# Patient Record
Sex: Male | Born: 1965 | Race: White | Hispanic: No | Smoking: Former smoker
Health system: Southern US, Community
[De-identification: ages and names within clinical notes are randomized; demographics above are authoritative.]

## PROBLEM LIST (undated history)

## (undated) DIAGNOSIS — Z950 Presence of cardiac pacemaker: Secondary | ICD-10-CM

## (undated) DIAGNOSIS — R21 Rash and other nonspecific skin eruption: Secondary | ICD-10-CM

## (undated) DIAGNOSIS — K5190 Ulcerative colitis, unspecified, without complications: Secondary | ICD-10-CM

## (undated) HISTORY — PX: PACEMAKER IMPLANT: EP1218

## (undated) HISTORY — DX: Presence of cardiac pacemaker: Z95.0

---

## 1999-07-15 ENCOUNTER — Encounter (INDEPENDENT_AMBULATORY_CARE_PROVIDER_SITE_OTHER): Payer: Self-pay | Admitting: *Deleted

## 1999-07-15 ENCOUNTER — Ambulatory Visit (HOSPITAL_COMMUNITY): Admission: RE | Admit: 1999-07-15 | Discharge: 1999-07-15 | Payer: Self-pay | Admitting: Gastroenterology

## 2001-03-23 ENCOUNTER — Emergency Department (HOSPITAL_COMMUNITY): Admission: EM | Admit: 2001-03-23 | Discharge: 2001-03-23 | Payer: Self-pay

## 2001-12-02 ENCOUNTER — Encounter: Payer: Self-pay | Admitting: Gastroenterology

## 2001-12-02 ENCOUNTER — Encounter: Admission: RE | Admit: 2001-12-02 | Discharge: 2001-12-02 | Payer: Self-pay | Admitting: Gastroenterology

## 2002-09-19 ENCOUNTER — Encounter (INDEPENDENT_AMBULATORY_CARE_PROVIDER_SITE_OTHER): Payer: Self-pay | Admitting: *Deleted

## 2002-09-19 ENCOUNTER — Ambulatory Visit (HOSPITAL_COMMUNITY): Admission: RE | Admit: 2002-09-19 | Discharge: 2002-09-19 | Payer: Self-pay | Admitting: Gastroenterology

## 2004-09-09 ENCOUNTER — Encounter: Admission: RE | Admit: 2004-09-09 | Discharge: 2004-09-09 | Payer: Self-pay | Admitting: Gastroenterology

## 2004-09-12 ENCOUNTER — Encounter (INDEPENDENT_AMBULATORY_CARE_PROVIDER_SITE_OTHER): Payer: Self-pay | Admitting: Specialist

## 2004-09-12 ENCOUNTER — Ambulatory Visit (HOSPITAL_COMMUNITY): Admission: RE | Admit: 2004-09-12 | Discharge: 2004-09-12 | Payer: Self-pay | Admitting: Gastroenterology

## 2004-10-11 ENCOUNTER — Encounter: Admission: RE | Admit: 2004-10-11 | Discharge: 2004-10-11 | Payer: Self-pay | Admitting: Gastroenterology

## 2006-06-02 ENCOUNTER — Ambulatory Visit: Payer: Self-pay | Admitting: Cardiovascular Disease

## 2007-08-25 ENCOUNTER — Ambulatory Visit: Payer: Self-pay | Admitting: Cardiovascular Disease

## 2007-09-15 ENCOUNTER — Ambulatory Visit: Payer: Self-pay | Admitting: Cardiology

## 2007-09-15 ENCOUNTER — Ambulatory Visit: Payer: Self-pay

## 2008-02-29 ENCOUNTER — Ambulatory Visit: Payer: Self-pay | Admitting: Cardiovascular Disease

## 2010-06-11 NOTE — Letter (Signed)
Jun 02, 2006    Ricky [name], Ricky [place]  [name], Ricky  [name]   RE:  Ricky, [name]  MRN:  [id number]  /  DOB:  05-14-1965   Dear Ricky [name],   It was my pleasure to see Ricky [name] as an outpatient at the  [facility] Cardiology office on Jun 02, 2006.  As you know, he is a 45-year-  old gentleman who presented with a chief complaint of chest pain.   Ricky [name] describes an episode of chest pain that felt like an ache  in the mid chest area yesterday evening.  It occurred at rest.  The  total duration of pain was 20 minutes.  It was nonradiating.  He has had  similar episodes in the past, but it has been a long time since his  previous episode.  He is unable to remember exactly how long it has been  since an episode of chest discomfort.  He did not have any associated  symptoms.  He specifically denies shortness of breath, edema,  lightheadedness, diaphoresis, nausea, or vomiting.  He has not had any  recent episodes of syncope, orthopnea, PND, leg claudication, or any  other cardiovascular complaints.  He has had no prior evaluation for  chest pain.   Current medications include only Asacol and Fibercon.   ALLERGIES:  TETRACYCLINE.   PAST MEDICAL HISTORY:  1. Ulcerative colitis.  He was diagnosed in 1993 after he had a severe      bout of diarrhea and abdominal pain.  He has done very well in the      interim on Asacol.  He is under the treatment of Dr. Teena Irani.  2. Questionable history of mitral valve prolapse.   There is no history of hypertension, diabetes, dyslipidemia, or any  other hospitalizations or surgeries except as mentioned.   SOCIAL HISTORY:  He is separated from his spouse.  He has one child.  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  He  does not smoke cigarettes, drink alcohol, or use any illicit drugs.   FAMILY HISTORY:  His parents are both alive in their 68s.  His  father  has diabetes and has had a TIA.  He has one brother.  There is no  history of coronary artery disease in any first-degree relatives.   REVIEW OF SYSTEMS:  A complete 12-point review of systems was performed.  Pertinent positives include seasonal allergies, constipation.  All other  systems are reviewed and are negative except as detailed above.   PHYSICAL EXAMINATION:  VITAL SIGNS:  Weight is 182 pounds.  Blood  pressure is 108/78 in the right arm, 103/72 in the left arm.  Heart rate  64.  Respiratory rate is 12.  GENERAL:  Patient is alert and oriented.  He is in no acute distress.  HEENT:  Normal.  NECK:  Normal carotid upstrokes without bruits.  Jugular venous pressure  is normal.  No thyromegaly or thyroid nodules.  LUNGS:  Clear to auscultation bilaterally.  HEART:  The apex is discrete and nondisplaced.  The heart is a regular  rate and rhythm without murmurs or gallops.  There is no right  ventricular heave or lift.  ABDOMEN:  Soft and nontender.  No organomegaly.  No abdominal bruits.  EXTREMITIES:  No clubbing, cyanosis or edema.  Peripheral pulses are 2+  and equal throughout.  SKIN:  Warm and  dry without rash.  LYMPHATICS:  There is no adenopathy.  NEUROLOGIC:  Cranial nerves II-XII are intact.  Strength is 5/5 and  equal in the arms and legs bilaterally.   EKG demonstrates a normal sinus rhythm with a borderline right  intraventricular conduction delay and T waves at the upper limits of  normal.   Ricky [name] is a 45 year old male with an episode of chest pain.  The  etiology of  his chest pain is unclear at this point.  I think his  overall cardiovascular risk is low, based on a paucity of risk factors.  I would like to perform an exercise treadmill stress test to see if his  T waves normalize with exertion.  I think his T wave abnormality is  likely a normal variant.  Pending the results of his stress test, we  will make a determination as to whether he  needs any further studies.  I  suspect an exercise treadmill will be sufficient.   At this point, I would not recommend any medication changes.  His blood  pressure is ideal, and I think the risk:benefit ratio of aspirin is not  in favor of taking that medication in the setting of ulcerative colitis.   Ricky [name], thanks again for the opportunity to see Ricky [name].  I will be  in touch after his exercise treadmill study.  Please feel free to call  at any time with questions regarding his care.    Sincerely,      Juanda Bond. Burt Knack, MD    MDC/MedQ  DD: 06/02/2006  DT: 06/02/2006  Job #: [id number]

## 2010-06-11 NOTE — Assessment & Plan Note (Signed)
[place] OFFICE NOTE   NAME:[name], Ricky KELTNER                    MRN:          [id number]  DATE:08/25/2007                            DOB:          12/03/1965    Ricky [name] was seen in followup at the [facility] Cardiology Office on  August 25, 2007.  He is a 45 year old gentleman who presents for  evaluation of near syncope.  I saw Ricky [name] last year for evaluation  of chest pain.  His symptoms were atypical and he has a paucity of  cardiac risk factors.  I recommended an exercise treadmill study, but  the patient did not return for followup.  He presents now after two near  syncopal episodes.  The first episode occurred in January when he was  out working in the cold weather.  He works in the [employer].  He went inside of a truck with the engine running in order  to warm up.  He realized that he was becoming very hot since he was in  all of his gear and he became lightheaded and nauseous.  After lying  down, his symptoms abated.  He did not lose consciousness at that time.  He recovered without incident until approximately 2 weeks ago when he  was once again working.  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  He describes diaphoresis and nausea.  He had no  chest pain, dyspnea, or other associated symptoms.  He again was able to  lie down and his symptoms resolved on their own.  In the interim, he has  had no problems.  He specifically denies exertional chest pain, dyspnea,  orthopnea, PND, or edema.   MEDICATIONS:  None.   ALLERGIES:  TETRACYCLINE.   PHYSICAL EXAMINATION:  GENERAL:  The patient is alert and oriented in no  acute distress.  VITALS:  Blood pressure is 104/80, pulse is 58, weight is 187 pounds.  HEENT:  Normal.  NECK:  Normal carotid upstrokes without bruits.  JVP normal.  LUNGS:  Clear bilaterally.  HEART:  Regular rate and rhythm without  murmurs or gallops.  ABDOMEN:  Soft, nontender, no organomegaly.  EXTREMITIES:  No clubbing, cyanosis, or edema.  Peripheral pulses 2+ and  equal.   EKG from July 10 shows sinus bradycardia and otherwise is within normal  limits.   ASSESSMENT:  This is a 45 year old gentleman with recurrent near  syncopal episodes.  He has no palpitations, chest pain, or other  associated symptoms.  By history, his symptoms sound like neurodepressor  episodes.  I still would like Ricky [name] to undergo an exercise  treadmill study to evaluate exercise tolerance and EKG changes with  activity.  I think this would be a reasonable evaluation in this  gentleman with minimal cardiac risk factors and a normal resting EKG.  If this is normal, I would not recommend further evaluation.  His  clinical exam is normal and there is no evidence of structural heart  disease.  I will see him back in follow up in 6 months  to make sure that he is not having recurrent problems.  In the interim,  I advised plenty of fluid intake and frequent meals.     Ricky Bond. Burt Knack, MD  Electronically Signed    MDC/MedQ  DD: 08/25/2007  DT: 08/26/2007  Job #: [id number]

## 2010-06-11 NOTE — Procedures (Signed)
[facility]                              EXERCISE TREADMILL   NAME:[name], [name]                    MRN:          [id number]  DATE:09/15/2007                            DOB:          Mar 14, 1965    PRIMARY CARDIOLOGIST:  Juanda Bond. Burt Knack, MD   [name] is a 46 year old white male patient who is having a stress  test today because of symptoms of presyncope.  The patient exercised 11  minutes 16 seconds with the Bruce protocol obtaining a peak heart rate  of 166, which was 93% of his maximum predicted heart rate.  He had  normal blood pressure response.  Test was stopped due to ankle and knee  pain.  He denies any dizziness or presyncopal signs or symptoms with  exercise.  EKG, baseline EKG, normal sinus rhythm, normal EKG.  He had  no ST-T wave.  He had no ST.  No significant EKG changes.  He did have  some peak T-waves during recovery.  Dr. Burt Knack to read final report.  The patient had good exercise tolerance.      Ermalinda Barrios, PA-C  Electronically Signed      Vanna Scotland. Olevia Perches, MD, [facility]  Electronically Signed   ML/MedQ  DD: 09/15/2007  DT: 09/16/2007  Job #: [id number]

## 2010-06-14 NOTE — Op Note (Signed)
   NAME:  Ricky [name], Ricky [name]                       ACCOUNT NO.:  [id number]   MEDICAL RECORD NO.:  [id number]                   PATIENT TYPE:  AMB   LOCATION:  ENDO                                 FACILITY:  [facility]   PHYSICIAN:  John C. Amedeo Plenty, M.D.                 DATE OF BIRTH:  Feb 22, 1965   DATE OF PROCEDURE:  09/19/2002  DATE OF DISCHARGE:                                 OPERATIVE REPORT   PROCEDURE:  Colonoscopy.   INDICATIONS FOR PROCEDURE:  A patient with chronic universal ulcerative  colitis of greater than 10 years duration. The procedure is for assessment  of possible dysplasia.   DESCRIPTION OF PROCEDURE:  The patient was placed in the left lateral  decubitus position then placed on the pulse monitor with continuous low flow  oxygen delivered by nasal cannula. He was sedated with 100 mcg IV fentanyl  and 9 mg IV Versed. The Olympus video colonoscope was inserted into the  rectum and advanced to the cecum, confirmed by transillumination at  McBurney's point and visualization of the ileocecal valve and appendiceal  orifice. The prep was excellent. There were diffuse mild changes of  erythema, granularity, friability and loss of the normal vascular pattern in  various areas of the colon predominantly in the ascending colon and cecum  with a fair amount of skipped areas. The involvement was similar but milder  and more patchy in distribution in the transverse and descending colon. The  sigmoid and rectum looked relatively normal,  no masses, polyps, diverticula  or other mucosal abnormalities were seen. Eight random biopsies were taken  of various segments of the colon to rule out dysplasia. The scope was then  withdrawn and the patient returned to the recovery room in stable condition.  The patient tolerated the procedure well and there were no immediate  complications.   IMPRESSION:  Mild active colitis more prominent in the right colon.   PLAN:  Await biopsy  results.                                               John C. Amedeo Plenty, M.D.    JCH/MEDQ  D:  09/19/2002  T:  09/19/2002  Job:  [id number]

## 2010-06-14 NOTE — Procedures (Signed)
[place]  Patient:    Ricky [name], Ricky [name]                    MRN: [id number] Proc. Date: 07/15/99 Adm. Date:  [id number] Disc. Date: [id number] Attending:  Rafael Bihari CC:         Dr. Guinevere Ferrari                           Procedure Report  PROCEDURE:  Colonoscopy.  ENDOSCOPIST:  Elyse Jarvis. Amedeo Plenty, M.D.  ANESTHESIA:  INDICATIONS FOR PROCEDURE:  Patient with ulcerative colitis involving the entire colon of at least eight years duration.  Due for endoscopic surveillance to assess for dysplasia, polyps, or colon cancer.  DESCRIPTION OF PROCEDURE:  The patient was placed in the left lateral decubitus position and placed on the pulse monitor with continuous low flow oxygen delivered by nasal cannula.  He was sedated with 100 mg of IV Demerol and 10 mg of IV Versed.  The Olympus video colonoscope was inserted into the rectum and advanced to the cecum, confirmed by transillumination at McBurneys point and visualization of the ileocecal valve and appendiceal orifice.  The terminal ileum was intubated and scored for several centimeters, and appeared to be normal.  Biopsies were taken of that area.  The cecum appeared normal with possible slight erythema and granularity, and biopsies were taken of the cecum as well.  The ascending colon appeared relatively normal.  In the transverse colon, there was a long, linear, fibrotic raised area which almost looked like a surgical anastomosis, but he had never had any surgery, and this is presumed a scar from healed colitis.  Biopsies were taken of the transverse colon in the vicinity of this ridge, but the remainder of the transverse, descending, and sigmoid colon appeared normal.  In the rectum, there were multiple discrete abscessed ulcers consistent with colitis with surrounding erythema and granularity, and biopsies were taken of the rectum as well.  The colonoscope was then withdrawn and the patient  returned to the recovery room in stable condition.  He tolerated the procedure well and there were no immediate complications.  IMPRESSION:  Evidence of colitis of the rectum and transverse colon with possible subtle changes of colitis in the cecum and other areas.  The terminal ileum appeared normal.  PLAN:  Await biopsy results to guide further therapy. DD:  07/15/99 TD:  07/17/99 Job: [id number]
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
## 2010-06-14 NOTE — Op Note (Signed)
NAME:  Ricky [name], Ricky [name]             ACCOUNT NO.:  [id number]   MEDICAL RECORD NO.:  [id number]          PATIENT TYPE:  AMB   LOCATION:  ENDO                         FACILITY:  [facility]   PHYSICIAN:  John C. Amedeo Plenty, M.D.    DATE OF BIRTH:  29-Jan-1965   DATE OF PROCEDURE:  09/12/2004  DATE OF DISCHARGE:                                 OPERATIVE REPORT   INDICATIONS FOR PROCEDURE:  Chronic ulcerative pancolitis of greater than 10  years duration.  The procedure is for high risk colon cancer screening.   PROCEDURE:  The patient was placed in the left lateral decubitus position  and placed on the pulse monitor with continuous low-flow oxygen delivered by  nasal cannula. He was sedated with 75 mcg IV fentanyl and 7 mg IV Versed.  The Olympus video colonoscope was inserted into the rectum and advanced to  the cecum, confirmed by transillumination of McBurney's point and  visualization of the ileocecal valve and appendiceal orifice.  The prep was  excellent. Within the cecum, ascending and transverse colon there was  diffuse granularity and friability with some streaks of fibrosis  particularly in the transverse colon. This very gradually faded to near  normal mucosa by about the descending to sigmoid colon and the rectal mucosa  appeared visibly normal. Multiple biopsies were taken along the cecum,  ascending, transverse and descending colon. No masses, polyps, diverticula,  ulcers, polyps or pseudopolyps were seen anywhere. The rectum appeared  normal. On retroflexed view, the anus revealed no obvious internal  hemorrhoids. The scope was then withdrawn and the patient returned to the  recovery room in stable condition.  He tolerated the procedure well and  there were no immediate complications.   IMPRESSION:  Ulcerative colitis.   PLAN:  Await histology to rule out dysplasia and if no high-grade dysplasia,  will probably repeat study in two years.           ______________________________  Elyse Jarvis Amedeo Plenty, M.D.     JCH/MEDQ  D:  09/12/2004  T:  09/12/2004  Job:  [id number]

## 2015-04-18 ENCOUNTER — Emergency Department (HOSPITAL_COMMUNITY): Payer: BC Managed Care – PPO

## 2015-04-18 ENCOUNTER — Emergency Department (HOSPITAL_COMMUNITY)
Admission: EM | Admit: 2015-04-18 | Discharge: 2015-04-18 | Disposition: A | Discharge status: Home / Self Care | Payer: BC Managed Care – PPO | Attending: Emergency Medicine | Admitting: Emergency Medicine

## 2015-04-18 ENCOUNTER — Encounter (HOSPITAL_COMMUNITY): Payer: Self-pay | Admitting: *Deleted

## 2015-04-18 DIAGNOSIS — B349 Viral infection, unspecified: Secondary | ICD-10-CM | POA: Insufficient documentation

## 2015-04-18 DIAGNOSIS — R55 Syncope and collapse: Secondary | ICD-10-CM | POA: Diagnosis not present

## 2015-04-18 DIAGNOSIS — Z87891 Personal history of nicotine dependence: Secondary | ICD-10-CM | POA: Insufficient documentation

## 2015-04-18 DIAGNOSIS — W228XXA Striking against or struck by other objects, initial encounter: Secondary | ICD-10-CM | POA: Insufficient documentation

## 2015-04-18 DIAGNOSIS — Z8719 Personal history of other diseases of the digestive system: Secondary | ICD-10-CM | POA: Diagnosis not present

## 2015-04-18 DIAGNOSIS — Y9289 Other specified places as the place of occurrence of the external cause: Secondary | ICD-10-CM | POA: Diagnosis not present

## 2015-04-18 DIAGNOSIS — Z79899 Other long term (current) drug therapy: Secondary | ICD-10-CM | POA: Insufficient documentation

## 2015-04-18 DIAGNOSIS — R21 Rash and other nonspecific skin eruption: Secondary | ICD-10-CM | POA: Insufficient documentation

## 2015-04-18 DIAGNOSIS — Y998 Other external cause status: Secondary | ICD-10-CM | POA: Diagnosis not present

## 2015-04-18 DIAGNOSIS — S0181XA Laceration without foreign body of other part of head, initial encounter: Secondary | ICD-10-CM | POA: Insufficient documentation

## 2015-04-18 DIAGNOSIS — J029 Acute pharyngitis, unspecified: Secondary | ICD-10-CM | POA: Diagnosis present

## 2015-04-18 DIAGNOSIS — Y9389 Activity, other specified: Secondary | ICD-10-CM | POA: Diagnosis not present

## 2015-04-18 DIAGNOSIS — K5190 Ulcerative colitis, unspecified, without complications: Secondary | ICD-10-CM | POA: Insufficient documentation

## 2015-04-18 HISTORY — DX: Ulcerative colitis, unspecified, without complications: K51.90

## 2015-04-18 HISTORY — DX: Rash and other nonspecific skin eruption: R21

## 2015-04-18 LAB — RAPID STREP SCREEN (MED CTR MEBANE ONLY): STREPTOCOCCUS, GROUP A SCREEN (DIRECT): NEGATIVE

## 2015-04-18 NOTE — ED Notes (Signed)
PT reports Flu Sx'S started on Monday after noon. Pt reports feeling generally weak ,and having cough with sore throat. Pt went to [facility] on [address] today and passed out in lobby. Pt hie his head and has  Bil. Skin tears to arms. Pt AO  And speaking in full sentences.

## 2015-04-18 NOTE — ED Notes (Signed)
Pt able to ambulate to the restroom independently

## 2015-04-18 NOTE — Discharge Instructions (Signed)
Please read and follow all provided instructions.  Your diagnoses today include:  1. Viral syndrome   2. Syncope    Tests performed today include:  Vital signs. See below for your results today.   Medications prescribed:   None   Home care instructions:  Follow any educational materials contained in this packet.  Follow-up instructions: Please follow-up with your primary care provider in the next week for further evaluation of symptoms and treatment   Return instructions:   Please return to the Emergency Department if you do not get better, if you get worse, or new symptoms OR  - Fever (temperature greater than 101.63F)  - Bleeding that does not stop with holding pressure to the area    -Severe pain (please note that you may be more sore the day after your accident)  - Chest Pain  - Difficulty breathing  - Severe nausea or vomiting  - Inability to tolerate food and liquids  - Passing out  - Skin becoming red around your wounds  - Change in mental status (confusion or lethargy)  - New numbness or weakness     Please return if you have any other emergent concerns.  Additional Information:  Your vital signs today were: BP 115/81 mmHg   Pulse 75   Resp 16   SpO2 98% If your blood pressure (BP) was elevated above 135/85 this visit, please have this repeated by your doctor within one month. ---------------

## 2015-04-18 NOTE — ED Provider Notes (Signed)
CSN: [id number]     Arrival date & time 04/18/15  1317 History   First MD Initiated Contact with Patient 04/18/15 1350     Chief Complaint  Patient presents with  . Cough  . Sore Throat  . Weakness   (Consider location/radiation/quality/duration/timing/severity/associated sxs/prior Treatment) HPI 50 y.o. male presents to the Emergency Department today complaining of URI symptoms since Monday. Associated weakness, cough, sore throat. Note sick contacts at work. No CP/SOB/ABD pain. Notes that he went to an UC this morning. As he was receiving fluids, he sat up and had a witnessed syncopal episode. Lasted 5 sec. Suffered hematoma to forehead with small superficial laceration. Noted bilateral skin tears on BUE. No pain currently. No headache. No changes in vision. No N/V/D. No other symptoms noted. Pt not on any anticoagulation.     Past Medical History  Diagnosis Date  . Ulcerative colitis ([place])   . Rash and nonspecific skin eruption    History reviewed. No pertinent past surgical history. History reviewed. No pertinent family history. Social History  Substance Use Topics  . Smoking status: Former Research scientist (life sciences)  . Smokeless tobacco: Never Used  . Alcohol Use: No    Review of Systems ROS reviewed and all are negative for acute change except as noted in the HPI.  Allergies  Tetracyclines & related  Home Medications   Prior to Admission medications   Medication Sig Start Date End Date Taking? Authorizing Provider  docusate sodium (COLACE) 100 MG capsule Take 100 mg by mouth daily.   Yes Historical Provider, MD  hydrOXYzine (ATARAX/VISTARIL) 25 MG tablet Take 25 mg by mouth every 6 (six) hours as needed.   Yes Historical Provider, MD  ibuprofen (ADVIL,MOTRIN) 200 MG tablet Take 400 mg by mouth every 6 (six) hours as needed for fever.   Yes Historical Provider, MD  mesalamine (ASACOL) 400 MG EC tablet Take 800 mg by mouth 3 (three) times daily.   Yes Historical Provider, MD  metoprolol  tartrate (LOPRESSOR) 25 MG tablet Take 25 mg by mouth 2 (two) times daily.   Yes Historical Provider, MD   BP 115/81 mmHg  Pulse 75  Resp 16  SpO2 98%   Physical Exam  Constitutional: He is oriented to person, place, and time. He appears well-developed and well-nourished. No distress.  HENT:  Head: Normocephalic and atraumatic.  Right Ear: Tympanic membrane, external ear and ear canal normal.  Left Ear: Tympanic membrane, external ear and ear canal normal.  Nose: Nose normal.  Mouth/Throat: Uvula is midline, oropharynx is clear and moist and mucous membranes are normal. No trismus in the jaw. No oropharyngeal exudate, posterior oropharyngeal erythema or tonsillar abscesses.  Eyes: EOM are normal. Pupils are equal, round, and reactive to light.  Neck: Normal range of motion. Neck supple. No tracheal deviation present.  Cardiovascular: Normal rate, regular rhythm, S1 normal, S2 normal, normal heart sounds, intact distal pulses and normal pulses.   No murmur heard. Pulmonary/Chest: Effort normal and breath sounds normal. No respiratory distress. He has no decreased breath sounds. He has no wheezes. He has no rhonchi. He has no rales. He exhibits no tenderness.  Abdominal: Soft. Normal appearance and bowel sounds are normal. There is no tenderness.  Musculoskeletal: Normal range of motion.  Neurological: He is alert and oriented to person, place, and time.  Cranial Nerves:  II: Pupils equal, round, reactive to light III,IV, VI: ptosis not present, extra-ocular motions intact bilaterally  V,VII: smile symmetric, facial light touch sensation equal VIII:  hearing grossly normal bilaterally  IX,X: midline uvula rise  XI: bilateral shoulder shrug equal and strong XII: midline tongue extension  Skin: Skin is warm and dry.  Psychiatric: He has a normal mood and affect. His speech is normal and behavior is normal. Thought content normal.  Nursing note and vitals reviewed.  ED Course   Procedures (including critical care time) Labs Review Labs Reviewed  RAPID STREP SCREEN (NOT AT [facility])  CULTURE, GROUP A STREP [facility])   Imaging Review Dg Chest 2 View  04/18/2015  CLINICAL DATA:  Flu-like symptoms, chills, cough, fatigue EXAM: CHEST  2 VIEW COMPARISON:  05/15/08 FINDINGS: Cardiomediastinal silhouette is unremarkable. Dual lead cardiac pacemaker with leads in right atrium and right ventricle. No acute infiltrate or pleural effusion. No pulmonary edema. Mild elevation of the right hemidiaphragm. IMPRESSION: No active cardiopulmonary disease. Dual lead cardiac pacemaker in place. Electronically Signed   By: Lahoma Crocker M.D.   On: 04/18/2015 14:59   Ct Head Wo Contrast  04/18/2015  CLINICAL DATA:  Syncope with a forehead laceration today. Initial encounter. EXAM: CT HEAD WITHOUT CONTRAST CT CERVICAL SPINE WITHOUT CONTRAST TECHNIQUE: Multidetector CT imaging of the head and cervical spine was performed following the standard protocol without intravenous contrast. Multiplanar CT image reconstructions of the cervical spine were also generated. COMPARISON:  None. FINDINGS: CT HEAD FINDINGS The brain appears normal without hemorrhage, infarct, mass lesion, mass effect, midline shift or abnormal extra-axial fluid collection. No hydrocephalus or pneumocephalus. The calvarium is intact. Imaged paranasal sinuses and mastoid air cells are clear. Laceration on the forehead is noted. CT CERVICAL SPINE FINDINGS There is no fracture or malalignment cervical spine. Mild loss disc space height and endplate spurring are seen at C5-6 and C6-7 paraspinous soft tissue structures are unremarkable. Lung apices are clear. IMPRESSION: Laceration of the forehead without underlying fracture. No acute intracranial abnormality. No acute abnormality cervical spine. Mild appearing degenerative disc disease C5-6 and C6-7. Electronically Signed   By: Inge Rise M.D.   On: 04/18/2015 16:03   Ct Cervical Spine Wo  Contrast  04/18/2015  CLINICAL DATA:  Syncope with a forehead laceration today. Initial encounter. EXAM: CT HEAD WITHOUT CONTRAST CT CERVICAL SPINE WITHOUT CONTRAST TECHNIQUE: Multidetector CT imaging of the head and cervical spine was performed following the standard protocol without intravenous contrast. Multiplanar CT image reconstructions of the cervical spine were also generated. COMPARISON:  None. FINDINGS: CT HEAD FINDINGS The brain appears normal without hemorrhage, infarct, mass lesion, mass effect, midline shift or abnormal extra-axial fluid collection. No hydrocephalus or pneumocephalus. The calvarium is intact. Imaged paranasal sinuses and mastoid air cells are clear. Laceration on the forehead is noted. CT CERVICAL SPINE FINDINGS There is no fracture or malalignment cervical spine. Mild loss disc space height and endplate spurring are seen at C5-6 and C6-7 paraspinous soft tissue structures are unremarkable. Lung apices are clear. IMPRESSION: Laceration of the forehead without underlying fracture. No acute intracranial abnormality. No acute abnormality cervical spine. Mild appearing degenerative disc disease C5-6 and C6-7. Electronically Signed   By: Inge Rise M.D.   On: 04/18/2015 16:03   I have personally reviewed and evaluated these images and lab results as part of my medical decision-making.   EKG Interpretation   Date/Time:  Wednesday April 18 2015 14:27:48 EDT Ventricular Rate:  73 PR Interval:  133 QRS Duration: 105 QT Interval:  360 QTC Calculation: 397 R Axis:   119 Text Interpretation:  Sinus rhythm Consider left atrial enlargement Right  axis deviation since last tracing no significant change Confirmed by BELFI   MD, MELANIE ([id number]) on 04/18/2015 2:36:35 PM      MDM  I have reviewed and evaluated the relevant laboratory values.I have reviewed and evaluated the relevant imaging studies.I personally evaluated and interpreted the relevant EKG.I have reviewed the  relevant previous healthcare records.I have reviewed EMS Documentation.I obtained HPI from historian. Patient discussed with supervising physician  ED Course:  Assessment: Pt is a 29yM who presents with URI symptoms since Monday as well as syncopal episode in UC today. Noted that he sat up from stretcher and had witnessed syncopal episode that lasted <5 sec. No seizure activity. No hx syncope. No CP/SOB. On exam, pt in NAD. Nontoxic/nonseptic appearing. VSS. Afebrile. Lungs CTA. Heart RRR. Abdomen nontender soft. Oropharynx erythematous. No exudate. Labs unremarkable. CXR unremarkable. CT Head unremarkable. Orthostatics obtained. Given fluids in ED. Most likely viral syndrome. Plan is to [place] with follow up to PCP further management of symptoms. At time of discharge, Patient is in no acute distress. Vital Signs are stable. Patient is able to ambulate. Patient able to tolerate PO.    Disposition/Plan:  DC Home Additional Verbal discharge instructions given and discussed with patient.  Pt Instructed to f/u with PCP in the next week for evaluation and treatment of symptoms. Return precautions given Pt acknowledges and agrees with plan  Supervising Physician Malvin Johns, MD   Final diagnoses:  Syncope  Viral syndrome      Shary Decamp, PA-C 04/18/15 [place], MD 04/18/15 1655

## 2015-04-18 NOTE — ED Notes (Signed)
Dermabond applied by PA

## 2015-04-20 LAB — CULTURE, GROUP A STREP (THRC)

## 2018-05-12 ENCOUNTER — Encounter: Payer: Self-pay | Admitting: Podiatry

## 2018-08-02 ENCOUNTER — Ambulatory Visit
Admission: RE | Admit: 2018-08-02 | Discharge: 2018-08-02 | Disposition: A | Discharge status: Home / Self Care | Payer: BC Managed Care – PPO | Source: Ambulatory Visit | Attending: Gastroenterology | Admitting: Gastroenterology

## 2018-08-02 ENCOUNTER — Other Ambulatory Visit: Payer: Self-pay | Admitting: Gastroenterology

## 2018-08-02 DIAGNOSIS — K51019 Ulcerative (chronic) pancolitis with unspecified complications: Secondary | ICD-10-CM

## 2019-02-10 DIAGNOSIS — Z0279 Encounter for issue of other medical certificate: Secondary | ICD-10-CM

## 2020-01-13 IMAGING — CR CHEST - 2 VIEW
2 series · 2 of 2 positions shown · non-contrast
Comparison: 05/17/2018

CLINICAL DATA: Premedication screening

EXAM:
CHEST - 2 VIEW

[w chest pa]
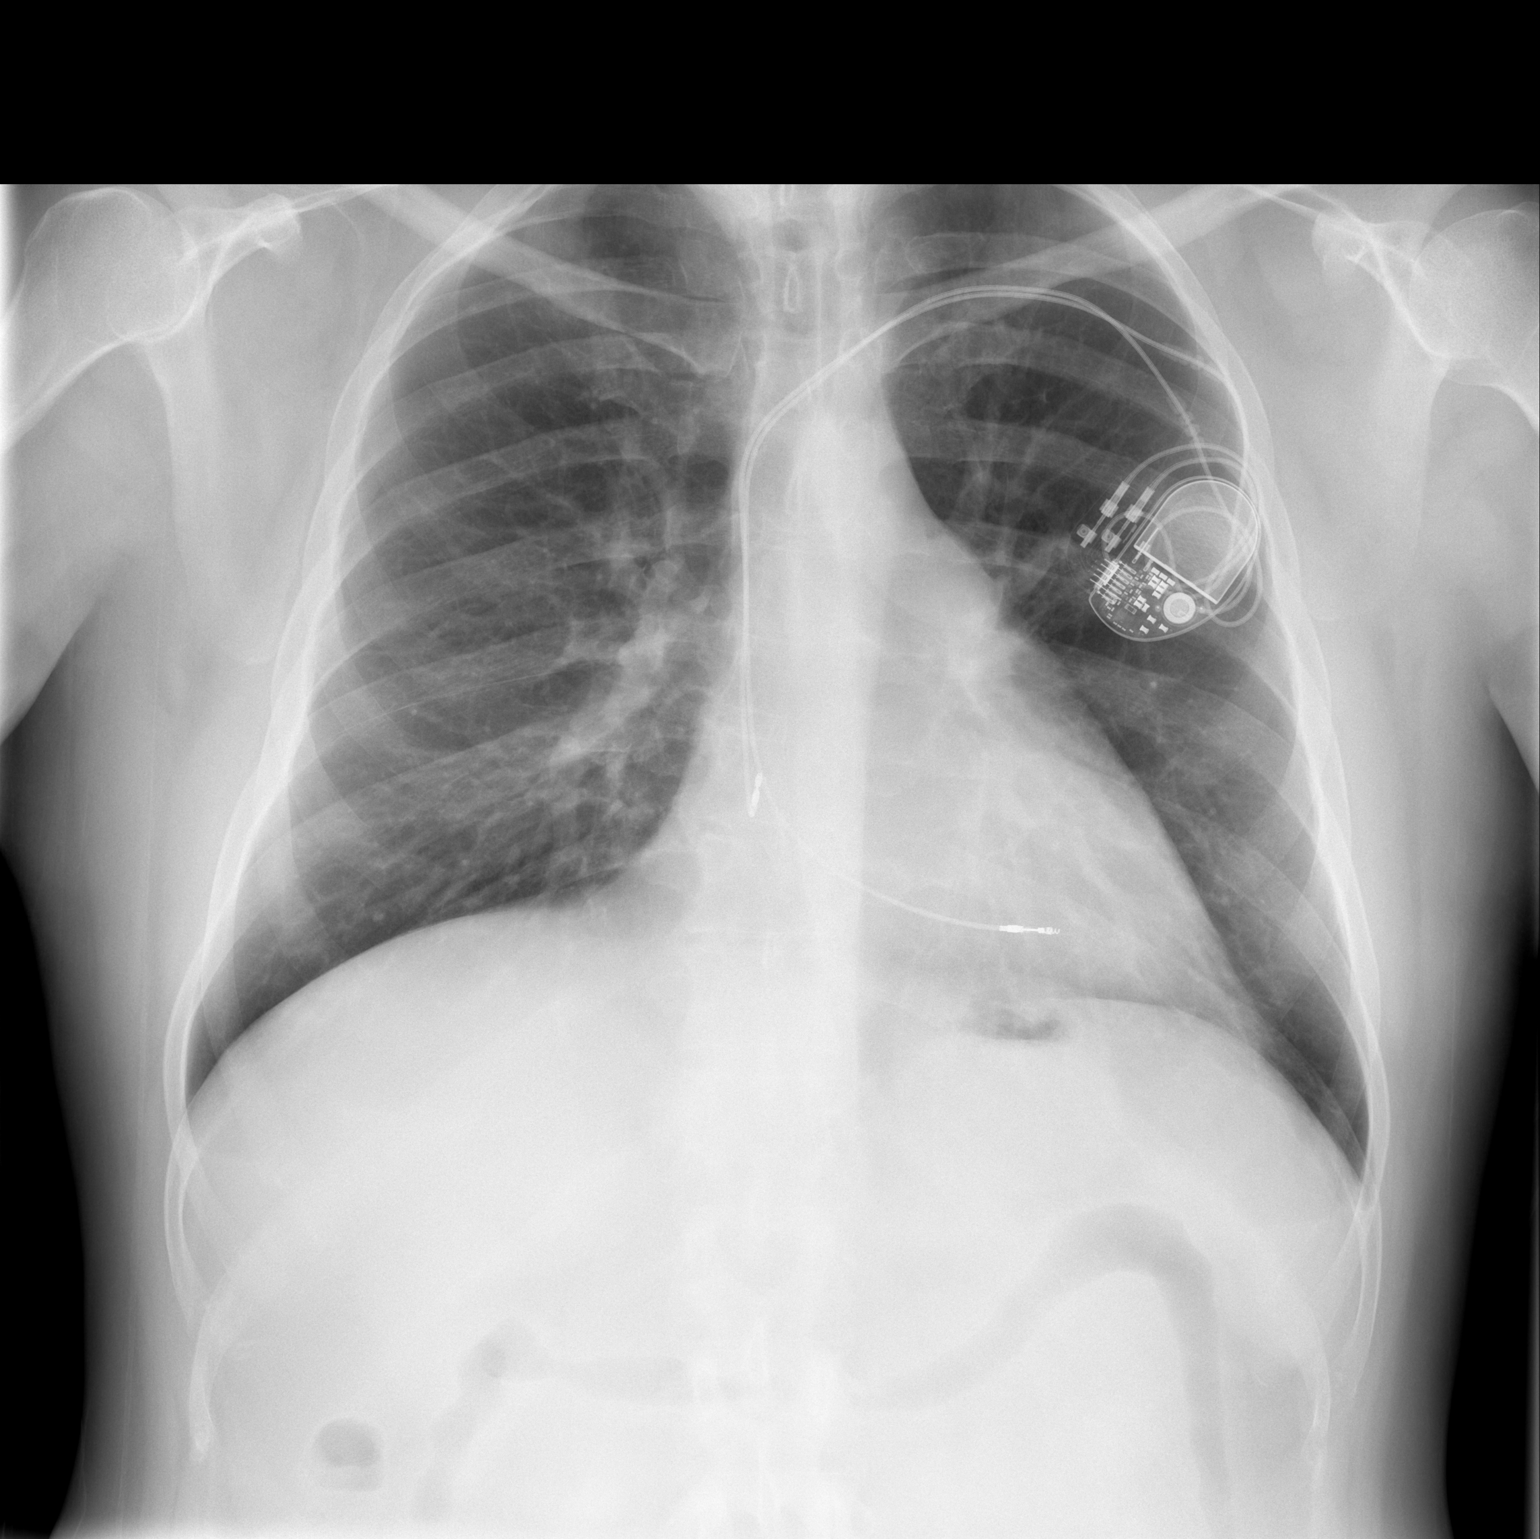

[w chest lat]
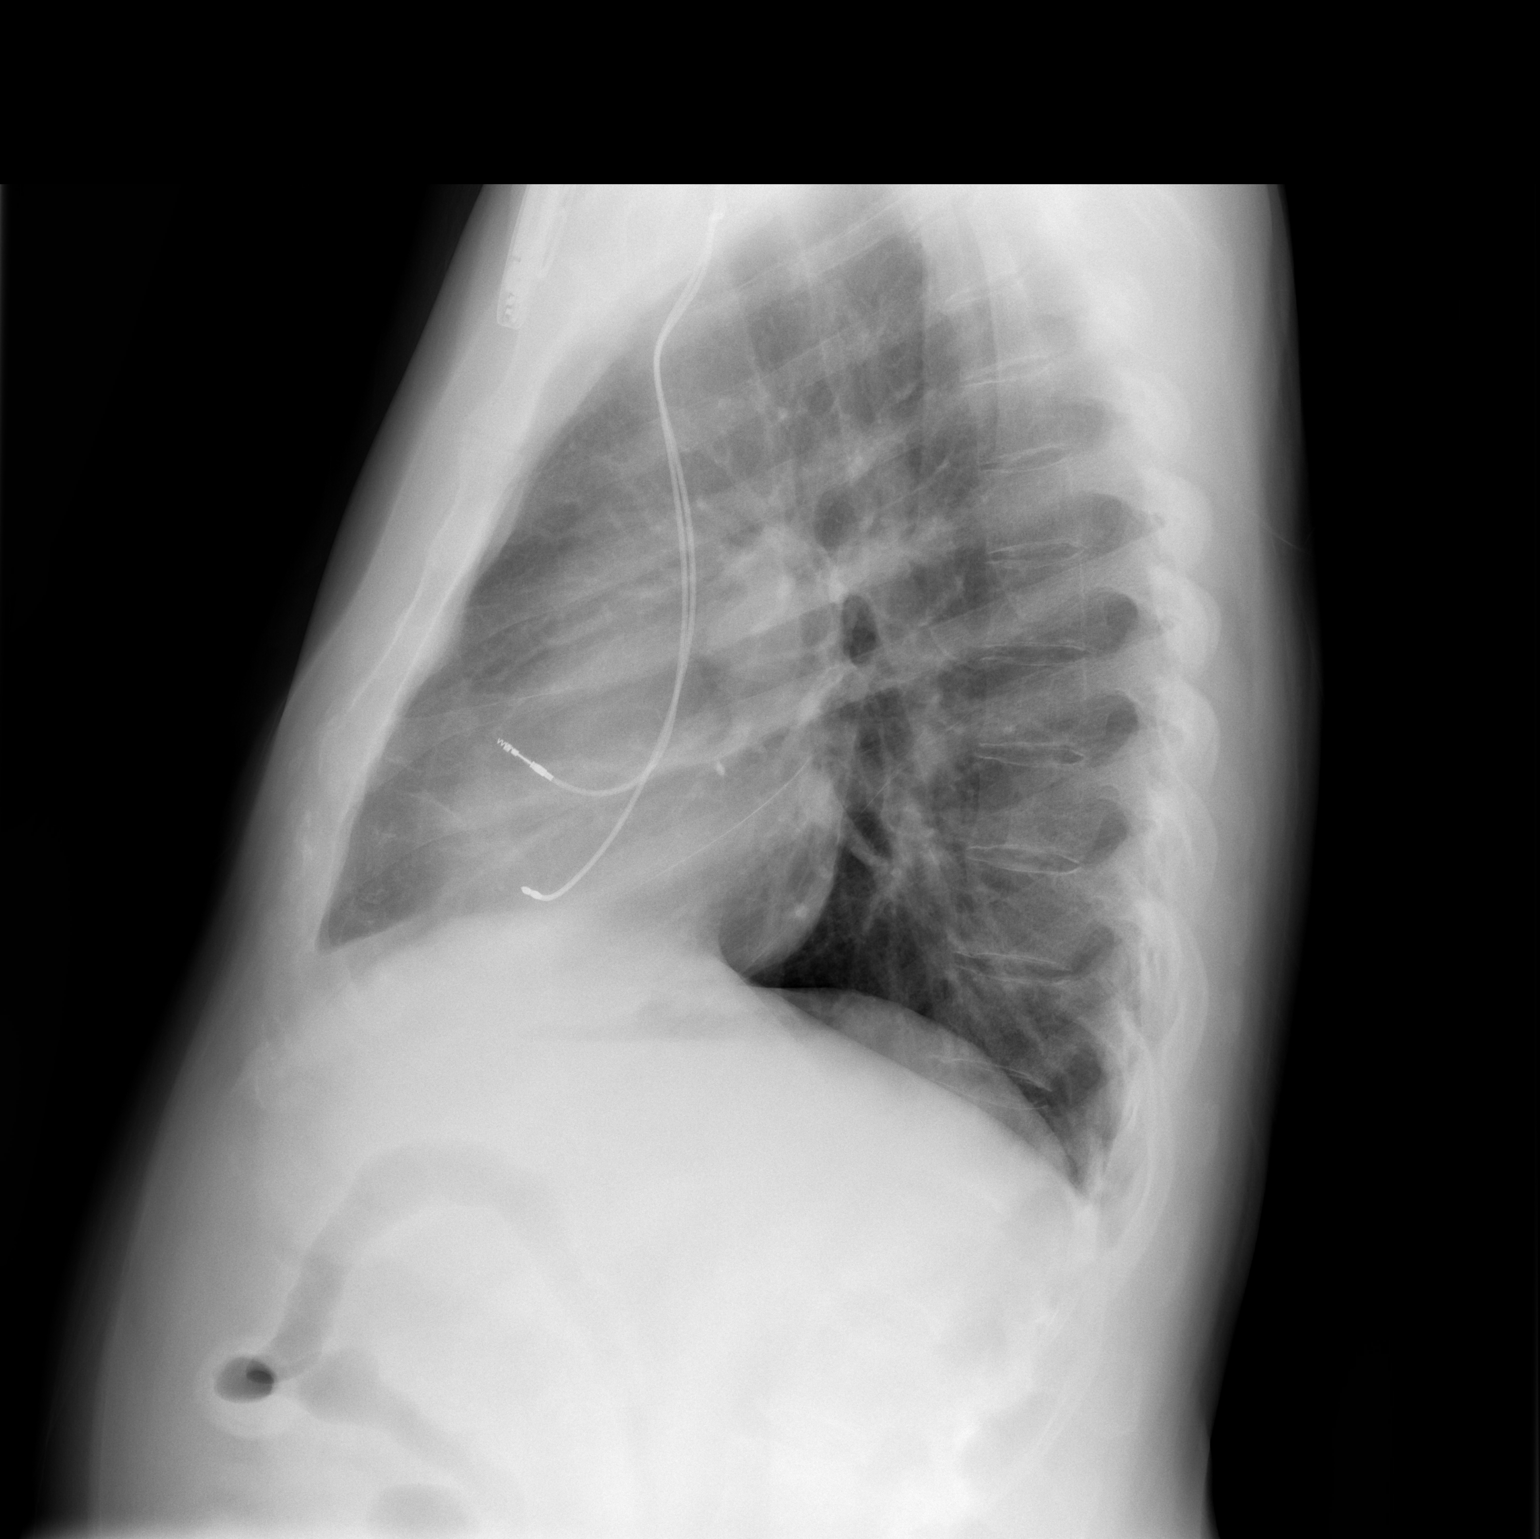

[2 of 2 positions shown; findings below may reference images not displayed]

FINDINGS: Cardiac shadow is stable. Pacing device is again seen. The lungs are
clear bilaterally. No sizable effusion is noted. No bony abnormality
is seen. Relatively featureless: Is noted in the upper abdomen. This
is consistent with the patient's given clinical history.
IMPRESSION: No acute intrathoracic abnormality is noted. There are changes in
the colon consistent with the given clinical history.

## 2023-02-05 ENCOUNTER — Encounter: Payer: Self-pay | Admitting: Emergency Medicine

## 2023-02-05 ENCOUNTER — Other Ambulatory Visit: Payer: Self-pay | Admitting: Emergency Medicine

## 2023-02-05 ENCOUNTER — Ambulatory Visit (INDEPENDENT_AMBULATORY_CARE_PROVIDER_SITE_OTHER): Payer: Managed Care, Other (non HMO) | Admitting: Emergency Medicine

## 2023-02-05 VITALS — BP 102/72 | HR 100 | Temp 98.4°F | Ht 67.5 in | Wt 198.0 lb

## 2023-02-05 DIAGNOSIS — Z13220 Encounter for screening for lipoid disorders: Secondary | ICD-10-CM | POA: Diagnosis not present

## 2023-02-05 DIAGNOSIS — Z130 Encounter for screening for diseases of the blood and blood-forming organs and certain disorders involving the immune mechanism: Secondary | ICD-10-CM

## 2023-02-05 DIAGNOSIS — Z0000 Encounter for general adult medical examination without abnormal findings: Secondary | ICD-10-CM | POA: Diagnosis not present

## 2023-02-05 DIAGNOSIS — Z13228 Encounter for screening for other metabolic disorders: Secondary | ICD-10-CM | POA: Diagnosis not present

## 2023-02-05 DIAGNOSIS — Z7689 Persons encountering health services in other specified circumstances: Secondary | ICD-10-CM

## 2023-02-05 DIAGNOSIS — Z125 Encounter for screening for malignant neoplasm of prostate: Secondary | ICD-10-CM

## 2023-02-05 DIAGNOSIS — Z1329 Encounter for screening for other suspected endocrine disorder: Secondary | ICD-10-CM

## 2023-02-05 DIAGNOSIS — K5190 Ulcerative colitis, unspecified, without complications: Secondary | ICD-10-CM

## 2023-02-05 DIAGNOSIS — Z1159 Encounter for screening for other viral diseases: Secondary | ICD-10-CM

## 2023-02-05 DIAGNOSIS — Z114 Encounter for screening for human immunodeficiency virus [HIV]: Secondary | ICD-10-CM

## 2023-02-05 DIAGNOSIS — E785 Hyperlipidemia, unspecified: Secondary | ICD-10-CM

## 2023-02-05 DIAGNOSIS — R972 Elevated prostate specific antigen [PSA]: Secondary | ICD-10-CM

## 2023-02-05 LAB — CBC WITH DIFFERENTIAL/PLATELET
Basophils Absolute: 0 10*3/uL (ref 0.0–0.1)
Basophils Relative: 0.5 % (ref 0.0–3.0)
Eosinophils Absolute: 0.1 10*3/uL (ref 0.0–0.7)
Eosinophils Relative: 1.5 % (ref 0.0–5.0)
HCT: 41.3 % (ref 39.0–52.0)
Hemoglobin: 13.5 g/dL (ref 13.0–17.0)
Lymphocytes Relative: 32.6 % (ref 12.0–46.0)
Lymphs Abs: 2.1 10*3/uL (ref 0.7–4.0)
MCHC: 32.7 g/dL (ref 30.0–36.0)
MCV: 93.5 fL (ref 78.0–100.0)
Monocytes Absolute: 0.7 10*3/uL (ref 0.1–1.0)
Monocytes Relative: 10.9 % (ref 3.0–12.0)
Neutro Abs: 3.5 10*3/uL (ref 1.4–7.7)
Neutrophils Relative %: 54.5 % (ref 43.0–77.0)
Platelets: 351 10*3/uL (ref 150.0–400.0)
RBC: 4.42 Mil/uL (ref 4.22–5.81)
RDW: 14.5 % (ref 11.5–15.5)
WBC: 6.5 10*3/uL (ref 4.0–10.5)

## 2023-02-05 LAB — COMPREHENSIVE METABOLIC PANEL
ALT: 31 U/L (ref 0–53)
AST: 31 U/L (ref 0–37)
Albumin: 4.4 g/dL (ref 3.5–5.2)
Alkaline Phosphatase: 83 U/L (ref 39–117)
BUN: 15 mg/dL (ref 6–23)
CO2: 29 meq/L (ref 19–32)
Calcium: 9.8 mg/dL (ref 8.4–10.5)
Chloride: 102 meq/L (ref 96–112)
Creatinine, Ser: 1.08 mg/dL (ref 0.40–1.50)
GFR: 75.74 mL/min (ref >60.00)
Glucose, Bld: 87 mg/dL (ref 70–99)
Potassium: 4.3 meq/L (ref 3.5–5.1)
Sodium: 139 meq/L (ref 135–145)
Total Bilirubin: 0.6 mg/dL (ref 0.2–1.2)
Total Protein: 7.2 g/dL (ref 6.0–8.3)

## 2023-02-05 LAB — PSA: PSA: 4.03 ng/mL — ABNORMAL HIGH (ref 0.10–4.00)

## 2023-02-05 LAB — LIPID PANEL
Cholesterol: 265 mg/dL — ABNORMAL HIGH (ref 0–200)
HDL: 60.8 mg/dL (ref >39.00)
LDL Cholesterol: 173 mg/dL — ABNORMAL HIGH (ref 0–99)
NonHDL: 204.58
Total CHOL/HDL Ratio: 4
Triglycerides: 156 mg/dL — ABNORMAL HIGH (ref 0.0–149.0)
VLDL: 31.2 mg/dL (ref 0.0–40.0)

## 2023-02-05 LAB — HEMOGLOBIN A1C: Hgb A1c MFr Bld: 5.8 % (ref 4.6–6.5)

## 2023-02-05 MED ORDER — ROSUVASTATIN CALCIUM 10 MG PO TABS: 10.0000 mg | ORAL_TABLET | Freq: Every day | ORAL | 3 refills | Status: DC

## 2023-02-05 NOTE — Progress Notes (Signed)
 Ricky [name] 58 y.o.   Chief Complaint  Patient presents with   Establish Care    Just to establish care, been years since he has had a check up. Would like a physical if possible. Also has some other questions he wants to ask Dr.     HISTORY OF PRESENT ILLNESS: This is a 58 y.o. male first visit to this office, here to establish care with me. Has history of ulcerative colitis on mesalamine.  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  Was advised to follow-up on this. Has no complaints or any other medical concerns today. Requesting physical exam.  HPI   Prior to Admission medications   Medication Sig Start Date End Date Taking? Authorizing Provider  ibuprofen (ADVIL,MOTRIN) 200 MG tablet Take 400 mg by mouth every 6 (six) hours as needed for fever.   Yes [provider]  mesalamine (ASACOL) 400 MG EC tablet Take 800 mg by mouth 3 (three) times daily.   Yes [provider]  metoprolol tartrate (LOPRESSOR) 25 MG tablet Take 25 mg by mouth 2 (two) times daily.   Yes [provider]  RINVOQ 15 MG TB24 Take 15 mg by mouth daily.   Yes [provider]  docusate sodium (COLACE) 100 MG capsule Take 100 mg by mouth daily. Patient not taking: Reported on 02/05/2023    [provider]  hydrOXYzine (ATARAX/VISTARIL) 25 MG tablet Take 25 mg by mouth every 6 (six) hours as needed. Patient not taking: Reported on 02/05/2023    [provider]    Allergies  Allergen Reactions   Tetracyclines & Related Rash    Patient Active Problem List   Diagnosis Date Noted   Ulcerative colitis ([facility])     Past Medical History:  Diagnosis Date   Pacemaker    Rash and nonspecific skin eruption    Ulcerative colitis ([facility])     Past Surgical History:  Procedure Laterality Date   PACEMAKER IMPLANT N/A     Social History    Socioeconomic History   Marital status: Divorced    Spouse name: Not on file   Number of children: 1   Years of education: some college   Highest education level: Not on file  Occupational History   Occupation: PTI securtiy  Tobacco Use   Smoking status: Former   Smokeless tobacco: Never  Advertising Account Planner   Vaping status: Never Used  Substance and Sexual Activity   Alcohol use: No   Drug use: No   Sexual activity: Not Currently  Other Topics Concern   Not on file  Social History Narrative   Not on file   Social Drivers of Health   Financial Resource Strain: Not on file  Food Insecurity: Not on file  Transportation Needs: Not on file  Physical Activity: Not on file  Stress: Not on file  Social Connections: Not on file  Intimate Partner Violence: Not on file    Family History  Problem Relation Age of Onset   COPD Mother    Alzheimer's disease Mother    Dementia Father    Heart attack Paternal Grandfather      Review of Systems  Constitutional: Negative.  Negative for chills and fever.  HENT: Negative.  Negative for congestion and sore throat.   Respiratory: Negative.  Negative for cough and shortness of breath.   Cardiovascular: Negative.  Negative for chest pain and palpitations.  Gastrointestinal:  Negative for abdominal pain, nausea and vomiting.  Genitourinary: Negative.  Negative for dysuria and hematuria.  Skin: Negative.  Negative for rash.  Neurological: Negative.  Negative for dizziness and headaches.  All other systems reviewed and are negative.   Vitals:   02/05/23 1039  BP: 102/72  Pulse: 100  Temp: 98.4 F (36.9 C)  SpO2: 96%    Physical Exam Vitals reviewed.  Constitutional:      Appearance: Normal appearance.  HENT:     Head: Normocephalic.     Right Ear: Tympanic membrane, ear canal and external ear normal.     Left Ear: Tympanic membrane, ear canal and external ear normal.     Mouth/Throat:     Mouth: Mucous membranes are moist.      Pharynx: Oropharynx is clear.  Eyes:     Extraocular Movements: Extraocular movements intact.     Conjunctiva/sclera: Conjunctivae normal.     Pupils: Pupils are equal, round, and reactive to light.  Cardiovascular:     Rate and Rhythm: Normal rate and regular rhythm.     Pulses: Normal pulses.     Heart sounds: Normal heart sounds.  Pulmonary:     Effort: Pulmonary effort is normal.     Breath sounds: Normal breath sounds.  Abdominal:     Palpations: Abdomen is soft.     Tenderness: There is no abdominal tenderness.  Musculoskeletal:     Cervical back: No tenderness.     Right lower leg: No edema.     Left lower leg: No edema.  Lymphadenopathy:     Cervical: No cervical adenopathy.  Skin:    General: Skin is warm and dry.  Neurological:     Mental Status: He is alert and oriented to person, place, and time.  Psychiatric:        Mood and Affect: Mood normal.        Behavior: Behavior normal.      ASSESSMENT & PLAN: Problem List Items Addressed This Visit       Digestive   Ulcerative colitis ([facility])   Clinically stable.  On mesalamine 800 mg 3 times a day Follows up with GI doctor on a regular basis Gets colonoscopies on a regular basis      Other Visit Diagnoses       Routine general medical examination at a health care facility    -  Primary   Relevant Orders   CBC with Differential/Platelet   Comprehensive metabolic panel   Hemoglobin A1c   Lipid panel   PSA     Encounter to establish care         Screening for deficiency anemia       Relevant Orders   CBC with Differential/Platelet     Screening for lipoid disorders       Relevant Orders   Lipid panel     Screening for endocrine, metabolic and immunity disorder       Relevant Orders   Comprehensive metabolic panel   Hemoglobin A1c     Screening for prostate cancer       Relevant Orders   PSA     Encounter for screening for HIV       Relevant Orders   HIV Antibody (routine testing w rflx)      Need for hepatitis C screening test       Relevant Orders   Hepatitis C antibody  Modifiable risk factors discussed with patient. Anticipatory guidance according to age provided. The following topics were also discussed: Social Determinants of Health Smoking.  Non-smoker Diet and nutrition Benefits of exercise Cancer screening and need for continued surveillance with colonoscopy Vaccinations reviewed and recommendations Cardiovascular risk assessment and need for blood work Mental health including depression and anxiety Fall and accident prevention  Patient Instructions  Health Maintenance, Male Adopting a healthy lifestyle and getting preventive care are important in promoting health and wellness. Ask your health care provider about: The right schedule for you to have regular tests and exams. Things you can do on your own to prevent diseases and keep yourself healthy. What should I know about diet, weight, and exercise? Eat a healthy diet  Eat a diet that includes plenty of vegetables, fruits, low-fat dairy products, and lean protein. Do not eat a lot of foods that are high in solid fats, added sugars, or sodium. Maintain a healthy weight Body mass index (BMI) is a measurement that can be used to identify possible weight problems. It estimates body fat based on height and weight. Your health care provider can help determine your BMI and help you achieve or maintain a healthy weight. Get regular exercise Get regular exercise. This is one of the most important things you can do for your health. Most adults should: Exercise for at least 150 minutes each week. The exercise should increase your heart rate and make you sweat (moderate-intensity exercise). Do strengthening exercises at least twice a week. This is in addition to the moderate-intensity exercise. Spend less time sitting. Even light physical activity can be beneficial. Watch cholesterol and blood lipids Have your  blood tested for lipids and cholesterol at 59 years of age, then have this test every 5 years. You may need to have your cholesterol levels checked more often if: Your lipid or cholesterol levels are high. You are older than 59 years of age. You are at high risk for heart disease. What should I know about cancer screening? Many types of cancers can be detected early and may often be prevented. Depending on your health history and family history, you may need to have cancer screening at various ages. This may include screening for: Colorectal cancer. Prostate cancer. Skin cancer. Lung cancer. What should I know about heart disease, diabetes, and high blood pressure? Blood pressure and heart disease High blood pressure causes heart disease and increases the risk of stroke. This is more likely to develop in people who have high blood pressure readings or are overweight. Talk with your health care provider about your target blood pressure readings. Have your blood pressure checked: Every 3-5 years if you are 76-4 years of age. Every year if you are 75 years old or older. If you are between the ages of 63 and 86 and are a current or former smoker, ask your health care provider if you should have a one-time screening for abdominal aortic aneurysm (AAA). Diabetes Have regular diabetes screenings. This checks your fasting blood sugar level. Have the screening done: Once every three years after age 44 if you are at a normal weight and have a low risk for diabetes. More often and at a younger age if you are overweight or have a high risk for diabetes. What should I know about preventing infection? Hepatitis B If you have a higher risk for hepatitis B, you should be screened for this virus. Talk with your health care provider to find out if you are  at risk for hepatitis B infection. Hepatitis C Blood testing is recommended for: Everyone born from [birth year] through 1966. Anyone with known risk factors  for hepatitis C. Sexually transmitted infections (STIs) You should be screened each year for STIs, including gonorrhea and chlamydia, if: You are sexually active and are younger than 59 years of age. You are older than 59 years of age and your health care provider tells you that you are at risk for this type of infection. Your sexual activity has changed since you were last screened, and you are at increased risk for chlamydia or gonorrhea. Ask your health care provider if you are at risk. Ask your health care provider about whether you are at high risk for HIV. Your health care provider may recommend a prescription medicine to help prevent HIV infection. If you choose to take medicine to prevent HIV, you should first get tested for HIV. You should then be tested every 3 months for as long as you are taking the medicine. Follow these instructions at home: Alcohol use Do not drink alcohol if your health care provider tells you not to drink. If you drink alcohol: Limit how much you have to 0-2 drinks a day. Know how much alcohol is in your drink. In the [place] one drink equals one 12 oz bottle of beer (355 mL), one 5 oz glass of wine (148 mL), or one 1 oz glass of hard liquor (44 mL). Lifestyle Do not use any products that contain nicotine or tobacco. These products include cigarettes, chewing tobacco, and vaping devices, such as e-cigarettes. If you need help quitting, ask your health care provider. Do not use street drugs. Do not share needles. Ask your health care provider for help if you need support or information about quitting drugs. General instructions Schedule regular health, dental, and eye exams. Stay current with your vaccines. Tell your health care provider if: You often feel depressed. You have ever been abused or do not feel safe at home. Summary Adopting a healthy lifestyle and getting preventive care are important in promoting health and wellness. Follow your health care  provider's instructions about healthy diet, exercising, and getting tested or screened for diseases. Follow your health care provider's instructions on monitoring your cholesterol and blood pressure. This information is not intended to replace advice given to you by your health care provider. Make sure you discuss any questions you have with your health care provider. Document Revised: 06/04/2020 Document Reviewed: 06/04/2020 Elsevier Patient Education  2024 Elsevier Inc.     Emil Schaumann, MD [facility]

## 2023-02-05 NOTE — Assessment & Plan Note (Signed)
 Clinically stable.  On mesalamine 800 mg 3 times a day Follows up with GI doctor on a regular basis Gets colonoscopies on a regular basis

## 2023-02-05 NOTE — Patient Instructions (Signed)
 Health Maintenance, Male  Adopting a healthy lifestyle and getting preventive care are important in promoting health and wellness. Ask your health care provider about:  The right schedule for you to have regular tests and exams.  Things you can do on your own to prevent diseases and keep yourself healthy.  What should I know about diet, weight, and exercise?  Eat a healthy diet    Eat a diet that includes plenty of vegetables, fruits, low-fat dairy products, and lean protein.  Do not eat a lot of foods that are high in solid fats, added sugars, or sodium.  Maintain a healthy weight  Body mass index (BMI) is a measurement that can be used to identify possible weight problems. It estimates body fat based on height and weight. Your health care provider can help determine your BMI and help you achieve or maintain a healthy weight.  Get regular exercise  Get regular exercise. This is one of the most important things you can do for your health. Most adults should:  Exercise for at least 150 minutes each week. The exercise should increase your heart rate and make you sweat (moderate-intensity exercise).  Do strengthening exercises at least twice a week. This is in addition to the moderate-intensity exercise.  Spend less time sitting. Even light physical activity can be beneficial.  Watch cholesterol and blood lipids  Have your blood tested for lipids and cholesterol at 59 years of age, then have this test every 5 years.  You may need to have your cholesterol levels checked more often if:  Your lipid or cholesterol levels are high.  You are older than 59 years of age.  You are at high risk for heart disease.  What should I know about cancer screening?  Many types of cancers can be detected early and may often be prevented. Depending on your health history and family history, you may need to have cancer screening at various ages. This may include screening for:  Colorectal cancer.  Prostate cancer.  Skin cancer.  Lung  cancer.  What should I know about heart disease, diabetes, and high blood pressure?  Blood pressure and heart disease  High blood pressure causes heart disease and increases the risk of stroke. This is more likely to develop in people who have high blood pressure readings or are overweight.  Talk with your health care provider about your target blood pressure readings.  Have your blood pressure checked:  Every 3-5 years if you are 24-52 years of age.  Every year if you are 3 years old or older.  If you are between the ages of 60 and 72 and are a current or former smoker, ask your health care provider if you should have a one-time screening for abdominal aortic aneurysm (AAA).  Diabetes  Have regular diabetes screenings. This checks your fasting blood sugar level. Have the screening done:  Once every three years after age 66 if you are at a normal weight and have a low risk for diabetes.  More often and at a younger age if you are overweight or have a high risk for diabetes.  What should I know about preventing infection?  Hepatitis B  If you have a higher risk for hepatitis B, you should be screened for this virus. Talk with your health care provider to find out if you are at risk for hepatitis B infection.  Hepatitis C  Blood testing is recommended for:  Everyone born from [birth year] through 1966.  Anyone  with known risk factors for hepatitis C.  Sexually transmitted infections (STIs)  You should be screened each year for STIs, including gonorrhea and chlamydia, if:  You are sexually active and are younger than 59 years of age.  You are older than 59 years of age and your health care provider tells you that you are at risk for this type of infection.  Your sexual activity has changed since you were last screened, and you are at increased risk for chlamydia or gonorrhea. Ask your health care provider if you are at risk.  Ask your health care provider about whether you are at high risk for HIV. Your health care provider  may recommend a prescription medicine to help prevent HIV infection. If you choose to take medicine to prevent HIV, you should first get tested for HIV. You should then be tested every 3 months for as long as you are taking the medicine.  Follow these instructions at home:  Alcohol use  Do not drink alcohol if your health care provider tells you not to drink.  If you drink alcohol:  Limit how much you have to 0-2 drinks a day.  Know how much alcohol is in your drink. In the [place] one drink equals one 12 oz bottle of beer (355 mL), one 5 oz glass of wine (148 mL), or one 1 oz glass of hard liquor (44 mL).  Lifestyle  Do not use any products that contain nicotine or tobacco. These products include cigarettes, chewing tobacco, and vaping devices, such as e-cigarettes. If you need help quitting, ask your health care provider.  Do not use street drugs.  Do not share needles.  Ask your health care provider for help if you need support or information about quitting drugs.  General instructions  Schedule regular health, dental, and eye exams.  Stay current with your vaccines.  Tell your health care provider if:  You often feel depressed.  You have ever been abused or do not feel safe at home.  Summary  Adopting a healthy lifestyle and getting preventive care are important in promoting health and wellness.  Follow your health care provider's instructions about healthy diet, exercising, and getting tested or screened for diseases.  Follow your health care provider's instructions on monitoring your cholesterol and blood pressure.  This information is not intended to replace advice given to you by your health care provider. Make sure you discuss any questions you have with your health care provider.  Document Revised: 06/04/2020 Document Reviewed: 06/04/2020  Elsevier Patient Education  2024 [employer].

## 2023-02-06 ENCOUNTER — Encounter: Payer: Self-pay | Admitting: Emergency Medicine

## 2023-02-06 LAB — HIV ANTIBODY (ROUTINE TESTING W REFLEX): HIV 1&2 Ab, 4th Generation: NONREACTIVE

## 2023-02-06 LAB — HEPATITIS C ANTIBODY: Hepatitis C Ab: NONREACTIVE

## 2023-09-30 ENCOUNTER — Telehealth: Payer: Self-pay

## 2023-09-30 NOTE — Transitions of Care (Post Inpatient/ED Visit) (Unsigned)
   09/30/2023  Name: Ricky [name] MRN: [id number] DOB: 20-Aug-1965  Today's TOC FU Call Status: Today's TOC FU Call Status:: Unsuccessful Call (1st Attempt) Unsuccessful Call (1st Attempt) Date: 09/30/23  Attempted to reach the patient regarding the most recent Inpatient/ED visit.  Follow Up Plan: Additional outreach attempts will be made to reach the patient to complete the Transitions of Care (Post Inpatient/ED visit) call.   Signature Julian Lemmings, LPN [facility] Nurse Health Advisor Direct Dial [phone]

## 2023-10-01 NOTE — Transitions of Care (Post Inpatient/ED Visit) (Signed)
   10/01/2023  Name: Ricky [name] MRN: [id number] DOB: 10-22-1965  Today's TOC FU Call Status: Today's TOC FU Call Status:: Successful TOC FU Call Completed Unsuccessful Call (1st Attempt) Date: 09/30/23 [facility] FU Call Complete Date: 10/01/23 Patient's Name and Date of Birth confirmed.  Transition Care Management Follow-up Telephone Call Date of Discharge: 09/29/23 Discharge Facility: Other [facility]) Name of Other ([facility]) Discharge Facility: [facility] Type of Discharge: Inpatient Admission Primary Inpatient Discharge Diagnosis:: colitis How have you been since you were released from the hospital?: Better Any questions or concerns?: No  Items Reviewed: Did you receive and understand the discharge instructions provided?: Yes Medications obtained,verified, and reconciled?: Yes (Medications Reviewed) Any new allergies since your discharge?: No Dietary orders reviewed?: Yes Do you have support at home?: No  Medications Reviewed Today: Medications Reviewed Today     Reviewed by Emmitt Pan, LPN (Licensed Practical Nurse) on 10/01/23 at 1111  Med List Status: <None>   Medication Order Taking? Sig Documenting Provider Last Dose Status Informant  docusate sodium (COLACE) 100 MG capsule [id number]  Take 100 mg by mouth daily.  Patient not taking: Reported on 10/01/2023   [provider]  Active Self  hydrOXYzine (ATARAX/VISTARIL) 25 MG tablet [id number]  Take 25 mg by mouth every 6 (six) hours as needed.  Patient not taking: Reported on 10/01/2023   [provider]  Active Self  ibuprofen (ADVIL,MOTRIN) 200 MG tablet [id number] Yes Take 400 mg by mouth every 6 (six) hours as needed for fever. [provider]  Active Self  mesalamine (ASACOL) 400 MG EC tablet [id number] Yes Take 800 mg by mouth 3 (three) times daily. [provider]  Active Self  metoprolol tartrate (LOPRESSOR) 25 MG tablet [id number] Yes Take 25 mg by mouth 2 (two) times daily.  [provider]  Active Self  RINVOQ 15 MG [id number] Yes Take 15 mg by mouth daily. [provider]  Active   rosuvastatin  (CRESTOR ) 10 MG tablet [id number] Yes Take 1 tablet (10 mg total) by mouth daily. Purcell Emil Schanz, MD  Active             Home Care and Equipment/Supplies: Were Home Health Services Ordered?: NA Any new equipment or medical supplies ordered?: NA  Functional Questionnaire: Do you need assistance with bathing/showering or dressing?: No Do you need assistance with meal preparation?: No Do you need assistance with eating?: No Do you have difficulty maintaining continence: No Do you need assistance with getting out of bed/getting out of a chair/moving?: No Do you have difficulty managing or taking your medications?: No  Follow up appointments reviewed: PCP Follow-up appointment confirmed?: No (declined appt) Specialist Hospital Follow-up appointment confirmed?: Yes Date of Specialist follow-up appointment?: 11/03/23 Follow-Up Specialty Provider:: GI Do you need transportation to your follow-up appointment?: No Do you understand care options if your condition(s) worsen?: Yes-patient verbalized understanding    SIGNATURE Pan Emmitt, LPN [facility] Nurse Health Advisor Direct Dial [phone]

## 2024-02-11 ENCOUNTER — Encounter: Payer: Self-pay | Admitting: Emergency Medicine

## 2024-02-11 ENCOUNTER — Ambulatory Visit: Payer: Managed Care, Other (non HMO) | Admitting: Emergency Medicine

## 2024-02-11 VITALS — BP 128/84 | HR 121 | Temp 97.9°F | Ht 67.5 in | Wt 198.0 lb

## 2024-02-11 DIAGNOSIS — Z0001 Encounter for general adult medical examination with abnormal findings: Secondary | ICD-10-CM | POA: Diagnosis not present

## 2024-02-11 DIAGNOSIS — Z1329 Encounter for screening for other suspected endocrine disorder: Secondary | ICD-10-CM | POA: Diagnosis not present

## 2024-02-11 DIAGNOSIS — E785 Hyperlipidemia, unspecified: Secondary | ICD-10-CM

## 2024-02-11 DIAGNOSIS — Z130 Encounter for screening for diseases of the blood and blood-forming organs and certain disorders involving the immune mechanism: Secondary | ICD-10-CM

## 2024-02-11 DIAGNOSIS — Z13220 Encounter for screening for lipoid disorders: Secondary | ICD-10-CM | POA: Diagnosis not present

## 2024-02-11 DIAGNOSIS — H9193 Unspecified hearing loss, bilateral: Secondary | ICD-10-CM | POA: Diagnosis not present

## 2024-02-11 DIAGNOSIS — Z125 Encounter for screening for malignant neoplasm of prostate: Secondary | ICD-10-CM

## 2024-02-11 DIAGNOSIS — K5190 Ulcerative colitis, unspecified, without complications: Secondary | ICD-10-CM

## 2024-02-11 DIAGNOSIS — Z13228 Encounter for screening for other metabolic disorders: Secondary | ICD-10-CM

## 2024-02-11 LAB — COMPREHENSIVE METABOLIC PANEL WITH GFR
ALT: 32 U/L (ref 3–53)
AST: 33 U/L (ref 5–37)
Albumin: 4.1 g/dL (ref 3.5–5.2)
Alkaline Phosphatase: 64 U/L (ref 39–117)
BUN: 15 mg/dL (ref 6–23)
CO2: 28 meq/L (ref 19–32)
Calcium: 9.3 mg/dL (ref 8.4–10.5)
Chloride: 106 meq/L (ref 96–112)
Creatinine, Ser: 1.18 mg/dL (ref 0.40–1.50)
GFR: 67.62 mL/min
Glucose, Bld: 94 mg/dL (ref 70–99)
Potassium: 4.6 meq/L (ref 3.5–5.1)
Sodium: 139 meq/L (ref 135–145)
Total Bilirubin: 0.8 mg/dL (ref 0.2–1.2)
Total Protein: 6.9 g/dL (ref 6.0–8.3)

## 2024-02-11 LAB — CBC WITH DIFFERENTIAL/PLATELET
Basophils Absolute: 0 K/uL (ref 0.0–0.1)
Basophils Relative: 0.5 % (ref 0.0–3.0)
Eosinophils Absolute: 0.3 K/uL (ref 0.0–0.7)
Eosinophils Relative: 4.2 % (ref 0.0–5.0)
HCT: 42.7 % (ref 39.0–52.0)
Hemoglobin: 14.5 g/dL (ref 13.0–17.0)
Lymphocytes Relative: 20.3 % (ref 12.0–46.0)
Lymphs Abs: 1.3 K/uL (ref 0.7–4.0)
MCHC: 33.9 g/dL (ref 30.0–36.0)
MCV: 88.3 fl (ref 78.0–100.0)
Monocytes Absolute: 0.7 K/uL (ref 0.1–1.0)
Monocytes Relative: 11.6 % (ref 3.0–12.0)
Neutro Abs: 3.9 K/uL (ref 1.4–7.7)
Neutrophils Relative %: 63.4 % (ref 43.0–77.0)
Platelets: 256 K/uL (ref 150.0–400.0)
RBC: 4.83 Mil/uL (ref 4.22–5.81)
RDW: 15.6 % — ABNORMAL HIGH (ref 11.5–15.5)
WBC: 6.2 K/uL (ref 4.0–10.5)

## 2024-02-11 LAB — HEMOGLOBIN A1C: Hgb A1c MFr Bld: 6.2 % (ref 4.6–6.5)

## 2024-02-11 LAB — LIPID PANEL
Cholesterol: 162 mg/dL (ref 28–200)
HDL: 83.2 mg/dL
LDL Cholesterol: 60 mg/dL (ref 10–99)
NonHDL: 78.33
Total CHOL/HDL Ratio: 2
Triglycerides: 91 mg/dL (ref 10.0–149.0)
VLDL: 18.2 mg/dL (ref 0.0–40.0)

## 2024-02-11 LAB — PSA: PSA: 4.65 ng/mL — ABNORMAL HIGH (ref 0.10–4.00)

## 2024-02-11 NOTE — Assessment & Plan Note (Signed)
Chronic and affecting quality of life. Recommend ENT evaluation. Referral placed today.

## 2024-02-11 NOTE — Patient Instructions (Signed)
 Health Maintenance, Male  Adopting a healthy lifestyle and getting preventive care are important in promoting health and wellness. Ask your health care provider about:  The right schedule for you to have regular tests and exams.  Things you can do on your own to prevent diseases and keep yourself healthy.  What should I know about diet, weight, and exercise?  Eat a healthy diet    Eat a diet that includes plenty of vegetables, fruits, low-fat dairy products, and lean protein.  Do not eat a lot of foods that are high in solid fats, added sugars, or sodium.  Maintain a healthy weight  Body mass index (BMI) is a measurement that can be used to identify possible weight problems. It estimates body fat based on height and weight. Your health care provider can help determine your BMI and help you achieve or maintain a healthy weight.  Get regular exercise  Get regular exercise. This is one of the most important things you can do for your health. Most adults should:  Exercise for at least 150 minutes each week. The exercise should increase your heart rate and make you sweat (moderate-intensity exercise).  Do strengthening exercises at least twice a week. This is in addition to the moderate-intensity exercise.  Spend less time sitting. Even light physical activity can be beneficial.  Watch cholesterol and blood lipids  Have your blood tested for lipids and cholesterol at 60 years of age, then have this test every 5 years.  You may need to have your cholesterol levels checked more often if:  Your lipid or cholesterol levels are high.  You are older than 60 years of age.  You are at high risk for heart disease.  What should I know about cancer screening?  Many types of cancers can be detected early and may often be prevented. Depending on your health history and family history, you may need to have cancer screening at various ages. This may include screening for:  Colorectal cancer.  Prostate cancer.  Skin cancer.  Lung  cancer.  What should I know about heart disease, diabetes, and high blood pressure?  Blood pressure and heart disease  High blood pressure causes heart disease and increases the risk of stroke. This is more likely to develop in people who have high blood pressure readings or are overweight.  Talk with your health care provider about your target blood pressure readings.  Have your blood pressure checked:  Every 3-5 years if you are 24-52 years of age.  Every year if you are 3 years old or older.  If you are between the ages of 60 and 72 and are a current or former smoker, ask your health care provider if you should have a one-time screening for abdominal aortic aneurysm (AAA).  Diabetes  Have regular diabetes screenings. This checks your fasting blood sugar level. Have the screening done:  Once every three years after age 66 if you are at a normal weight and have a low risk for diabetes.  More often and at a younger age if you are overweight or have a high risk for diabetes.  What should I know about preventing infection?  Hepatitis B  If you have a higher risk for hepatitis B, you should be screened for this virus. Talk with your health care provider to find out if you are at risk for hepatitis B infection.  Hepatitis C  Blood testing is recommended for:  Everyone born from [birth year] through 1966.  Anyone  with known risk factors for hepatitis C.  Sexually transmitted infections (STIs)  You should be screened each year for STIs, including gonorrhea and chlamydia, if:  You are sexually active and are younger than 60 years of age.  You are older than 60 years of age and your health care provider tells you that you are at risk for this type of infection.  Your sexual activity has changed since you were last screened, and you are at increased risk for chlamydia or gonorrhea. Ask your health care provider if you are at risk.  Ask your health care provider about whether you are at high risk for HIV. Your health care provider  may recommend a prescription medicine to help prevent HIV infection. If you choose to take medicine to prevent HIV, you should first get tested for HIV. You should then be tested every 3 months for as long as you are taking the medicine.  Follow these instructions at home:  Alcohol use  Do not drink alcohol if your health care provider tells you not to drink.  If you drink alcohol:  Limit how much you have to 0-2 drinks a day.  Know how much alcohol is in your drink. In the [place] one drink equals one 12 oz bottle of beer (355 mL), one 5 oz glass of wine (148 mL), or one 1 oz glass of hard liquor (44 mL).  Lifestyle  Do not use any products that contain nicotine or tobacco. These products include cigarettes, chewing tobacco, and vaping devices, such as e-cigarettes. If you need help quitting, ask your health care provider.  Do not use street drugs.  Do not share needles.  Ask your health care provider for help if you need support or information about quitting drugs.  General instructions  Schedule regular health, dental, and eye exams.  Stay current with your vaccines.  Tell your health care provider if:  You often feel depressed.  You have ever been abused or do not feel safe at home.  Summary  Adopting a healthy lifestyle and getting preventive care are important in promoting health and wellness.  Follow your health care provider's instructions about healthy diet, exercising, and getting tested or screened for diseases.  Follow your health care provider's instructions on monitoring your cholesterol and blood pressure.  This information is not intended to replace advice given to you by your health care provider. Make sure you discuss any questions you have with your health care provider.  Document Revised: 06/04/2020 Document Reviewed: 06/04/2020  Elsevier Patient Education  2024 [employer].

## 2024-02-11 NOTE — Assessment & Plan Note (Signed)
 Clinically stable.  Had surgery last year.  No complications. Off mesalamine now. Presently on Rinvok 15 mg daily

## 2024-02-11 NOTE — Progress Notes (Signed)
 Ricky [name] 59 y.o.   Chief Complaint  Patient presents with   Annual Exam    HISTORY OF PRESENT ILLNESS: This is a 59 y.o. male here for annual exam and follow-up on chronic medical conditions History of ulcerative colitis.  Underwent abdominal surgery last year.  Off mesalamine now.  Recovering well.. During physical lab work last year PSA was found to be elevated and was referred to urologist.  Had surgery done.  PSA levels went down according to him. Had COVID last December.  Recovered well. Complaining of bilateral hearing loss.  Needs ENT referral Has no other complaints or medical concerns today.  HPI   Prior to Admission medications  Medication Sig Start Date End Date Taking? Authorizing Provider  docusate sodium (COLACE) 100 MG capsule Take 100 mg by mouth daily.   Yes [provider]  ibuprofen (ADVIL,MOTRIN) 200 MG tablet Take 400 mg by mouth every 6 (six) hours as needed for fever.   Yes [provider]  metoprolol tartrate (LOPRESSOR) 25 MG tablet Take 25 mg by mouth 2 (two) times daily.   Yes [provider]  RINVOQ 15 MG TB24 Take 15 mg by mouth daily.   Yes [provider]  rosuvastatin  (CRESTOR ) 10 MG tablet Take 1 tablet (10 mg total) by mouth daily. 02/05/23  Yes Nhia Heaphy Jose, MD  hydrOXYzine (ATARAX/VISTARIL) 25 MG tablet Take 25 mg by mouth every 6 (six) hours as needed. Patient not taking: Reported on 02/11/2024    [provider]  mesalamine (ASACOL) 400 MG EC tablet Take 800 mg by mouth 3 (three) times daily. 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  Tobacco Use   Smoking status: Former   Smokeless tobacco: Never  Advertising Account Planner   Vaping status: Never Used  Substance and Sexual Activity   Alcohol use: No   Drug use: No   Sexual activity: Not Currently  Other Topics Concern   Not on file  Social History Narrative   Not on file   Social Drivers of Health   Tobacco Use: Medium Risk (02/11/2024)   Patient History    Smoking Tobacco Use: Former    Smokeless Tobacco Use: Never    Passive Exposure: Not on Actuary Strain: Low Risk (02/10/2024)   Overall Financial Resource Strain (CARDIA)    Difficulty of Paying Living Expenses: Not hard at all  Food Insecurity: No Food Insecurity (02/10/2024)   Epic    Worried About Programme Researcher, Broadcasting/film/video in the Last Year: Never true    Ran Out of Food in the Last Year: Never true  Transportation Needs: No Transportation Needs (02/10/2024)   Epic    Lack of Transportation (Medical): No    Lack of Transportation (Non-Medical): No  Physical Activity: Unknown (02/10/2024)  Exercise Vital Sign    Days of Exercise per Week: 3 days    Minutes of Exercise per Session: Patient declined  Stress: Stress Concern Present (02/10/2024)   [employer] of Occupational Health - Occupational Stress Questionnaire    Feeling of Stress: Very much  Social Connections: Socially Isolated (02/10/2024)   Social Connection and Isolation Panel    Frequency of Communication with Friends and Family: More than three times a week    Frequency of Social Gatherings with Friends and Family: Never    Attends Religious Services: Never    Database Administrator or Organizations: No    Attends Engineer, Structural: Not on file    Marital Status: Divorced  Intimate Partner  Violence: Not on file  Depression (PHQ2-9): Low Risk (02/11/2024)   Depression (PHQ2-9)    PHQ-2 Score: 0  Alcohol Screen: Not on file  Housing: Unknown (02/10/2024)   Epic    Unable to Pay for Housing in the Last Year: No    Number of Times Moved in the Last Year: Not on file    Homeless in the Last Year: No  Utilities: Not on file  Health Literacy: Not on file    Family History  Problem Relation Age of Onset   COPD Mother    Alzheimer's disease Mother    Dementia Father    Heart attack Paternal Grandfather      Review of Systems  Constitutional: Negative.  Negative for chills and fever.  HENT:  Positive for hearing loss. Negative for congestion and sore throat.   Respiratory: Negative.  Negative for cough and shortness of breath.   Cardiovascular: Negative.  Negative for chest pain and palpitations.  Gastrointestinal:  Negative for abdominal pain, diarrhea, nausea and vomiting.  Genitourinary: Negative.  Negative for dysuria and hematuria.  Skin: Negative.  Negative for rash.  Neurological: Negative.  Negative for dizziness and headaches.  All other systems reviewed and are negative.   Vitals:   02/11/24 0952  BP: 128/84  Pulse: (!) 121  Temp: 97.9 F (36.6 C)  SpO2: 97%    Physical Exam Vitals reviewed.  Constitutional:      Appearance: Normal appearance.  HENT:     Head: Normocephalic.     Right Ear: Tympanic membrane, ear canal and external ear normal.     Left Ear: Tympanic membrane, ear canal and external ear normal.     Mouth/Throat:     Mouth: Mucous membranes are moist.     Pharynx: Oropharynx is clear.  Eyes:     Extraocular Movements: Extraocular movements intact.     Conjunctiva/sclera: Conjunctivae normal.     Pupils: Pupils are equal, round, and reactive to light.  Cardiovascular:     Rate and Rhythm: Normal rate and regular rhythm.     Pulses: Normal pulses.     Heart sounds: Normal heart sounds.  Pulmonary:     Effort: Pulmonary effort is  normal.     Breath sounds: Normal breath sounds.  Abdominal:     Palpations: Abdomen is soft.     Tenderness: There is no abdominal tenderness.  Musculoskeletal:     Cervical back: Neck supple. No tenderness.  Lymphadenopathy:     Cervical: No cervical adenopathy.  Skin:    General: Skin is warm and dry.  Neurological:     General: No focal deficit present.     Mental Status: He is alert and oriented to person, place, and time.  Psychiatric:  Mood and Affect: Mood normal.        Behavior: Behavior normal.      ASSESSMENT & PLAN: Problem List Items Addressed This Visit       Digestive   Ulcerative colitis (HCC)   Clinically stable.  Had surgery last year.  No complications. Off mesalamine now. Presently on Rinvok 15 mg daily        Nervous and Auditory   Bilateral hearing loss   Chronic and affecting quality of life Recommend ENT evaluation Referral placed today      Relevant Orders   Ambulatory referral to ENT     Other   Dyslipidemia   Chronic stable condition Cardiovascular risks associated with dyslipidemia discussed Continues rosuvastatin  10 mg daily Lipid profile done today Diet and nutrition discussed The 10-year ASCVD risk score (Arnett DK, et al., 2019) is: 10.4%   Values used to calculate the score:     Age: 4 years     Clinically relevant sex: Male     Is Non-Hispanic African American: No     Diabetic: No     Tobacco smoker: No     Systolic Blood Pressure: 128 mmHg     Is BP treated: Yes     HDL Cholesterol: 60.8 mg/dL     Total Cholesterol: 265 mg/dL       Other Visit Diagnoses       Encounter for general adult medical examination with abnormal findings    -  Primary   Relevant Orders   CBC with Differential/Platelet   Comprehensive metabolic panel with GFR   Hemoglobin A1c   Lipid panel   PSA     Screening for deficiency anemia       Relevant Orders   CBC with Differential/Platelet     Screening for lipoid disorders        Relevant Orders   Lipid panel     Screening for endocrine, metabolic and immunity disorder       Relevant Orders   Comprehensive metabolic panel with GFR   Hemoglobin A1c     Screening for prostate cancer       Relevant Orders   PSA      Modifiable risk factors discussed with patient. Anticipatory guidance according to age provided. The following topics were also discussed: Social Determinants of Health Smoking.  Non-smoker Diet and nutrition Benefits of exercise Cancer screening and review of most recent colonoscopy report Vaccinations review and recommendations Cardiovascular risk assessment The 10-year ASCVD risk score (Arnett DK, et al., 2019) is: 10.4%   Values used to calculate the score:     Age: 4 years     Clinically relevant sex: Male     Is Non-Hispanic African American: No     Diabetic: No     Tobacco smoker: No     Systolic Blood Pressure: 128 mmHg     Is BP treated: Yes     HDL Cholesterol: 60.8 mg/dL     Total Cholesterol: 265 mg/dL  Mental health including depression and anxiety Fall and accident prevention  Patient Instructions  Health Maintenance, Male Adopting a healthy lifestyle and getting preventive care are important in promoting health and wellness. Ask your health care provider about: The right schedule for you to have regular tests and exams. Things you can do on your own to prevent diseases and keep yourself healthy. What should I know about diet, weight, and exercise? Eat a healthy diet  Eat a diet that includes plenty  of vegetables, fruits, low-fat dairy products, and lean protein. Do not eat a lot of foods that are high in solid fats, added sugars, or sodium. Maintain a healthy weight Body mass index (BMI) is a measurement that can be used to identify possible weight problems. It estimates body fat based on height and weight. Your health care provider can help determine your BMI and help you achieve or maintain a healthy weight. Get  regular exercise Get regular exercise. This is one of the most important things you can do for your health. Most adults should: Exercise for at least 150 minutes each week. The exercise should increase your heart rate and make you sweat (moderate-intensity exercise). Do strengthening exercises at least twice a week. This is in addition to the moderate-intensity exercise. Spend less time sitting. Even light physical activity can be beneficial. Watch cholesterol and blood lipids Have your blood tested for lipids and cholesterol at 60 years of age, then have this test every 5 years. You may need to have your cholesterol levels checked more often if: Your lipid or cholesterol levels are high. You are older than 60 years of age. You are at high risk for heart disease. What should I know about cancer screening? Many types of cancers can be detected early and may often be prevented. Depending on your health history and family history, you may need to have cancer screening at various ages. This may include screening for: Colorectal cancer. Prostate cancer. Skin cancer. Lung cancer. What should I know about heart disease, diabetes, and high blood pressure? Blood pressure and heart disease High blood pressure causes heart disease and increases the risk of stroke. This is more likely to develop in people who have high blood pressure readings or are overweight. Talk with your health care provider about your target blood pressure readings. Have your blood pressure checked: Every 3-5 years if you are 69-66 years of age. Every year if you are 73 years old or older. If you are between the ages of 51 and 70 and are a current or former smoker, ask your health care provider if you should have a one-time screening for abdominal aortic aneurysm (AAA). Diabetes Have regular diabetes screenings. This checks your fasting blood sugar level. Have the screening done: Once every three years after age 67 if you are at  a normal weight and have a low risk for diabetes. More often and at a younger age if you are overweight or have a high risk for diabetes. What should I know about preventing infection? Hepatitis B If you have a higher risk for hepatitis B, you should be screened for this virus. Talk with your health care provider to find out if you are at risk for hepatitis B infection. Hepatitis C Blood testing is recommended for: Everyone born from [birth year] through 1966. Anyone with known risk factors for hepatitis C. Sexually transmitted infections (STIs) You should be screened each year for STIs, including gonorrhea and chlamydia, if: You are sexually active and are younger than 60 years of age. You are older than 60 years of age and your health care provider tells you that you are at risk for this type of infection. Your sexual activity has changed since you were last screened, and you are at increased risk for chlamydia or gonorrhea. Ask your health care provider if you are at risk. Ask your health care provider about whether you are at high risk for HIV. Your health care provider may recommend a prescription medicine  to help prevent HIV infection. If you choose to take medicine to prevent HIV, you should first get tested for HIV. You should then be tested every 3 months for as long as you are taking the medicine. Follow these instructions at home: Alcohol use Do not drink alcohol if your health care provider tells you not to drink. If you drink alcohol: Limit how much you have to 0-2 drinks a day. Know how much alcohol is in your drink. In the [place] one drink equals one 12 oz bottle of beer (355 mL), one 5 oz glass of wine (148 mL), or one 1 oz glass of hard liquor (44 mL). Lifestyle Do not use any products that contain nicotine or tobacco. These products include cigarettes, chewing tobacco, and vaping devices, such as e-cigarettes. If you need help quitting, ask your health care provider. Do not use  street drugs. Do not share needles. Ask your health care provider for help if you need support or information about quitting drugs. General instructions Schedule regular health, dental, and eye exams. Stay current with your vaccines. Tell your health care provider if: You often feel depressed. You have ever been abused or do not feel safe at home. Summary Adopting a healthy lifestyle and getting preventive care are important in promoting health and wellness. Follow your health care provider's instructions about healthy diet, exercising, and getting tested or screened for diseases. Follow your health care provider's instructions on monitoring your cholesterol and blood pressure. This information is not intended to replace advice given to you by your health care provider. Make sure you discuss any questions you have with your health care provider. Document Revised: 06/04/2020 Document Reviewed: 06/04/2020 Elsevier Patient Education  2024 Elsevier Inc.     Emil Schaumann, MD [facility]    [1]  Allergies Allergen Reactions   Tetracyclines & Related Rash

## 2024-02-11 NOTE — Assessment & Plan Note (Signed)
 Chronic stable condition Cardiovascular risks associated with dyslipidemia discussed Continues rosuvastatin  10 mg daily Lipid profile done today Diet and nutrition discussed The 10-year ASCVD risk score (Arnett DK, et al., 2019) is: 10.4%   Values used to calculate the score:     Age: 59 years     Clinically relevant sex: Male     Is Non-Hispanic African American: No     Diabetic: No     Tobacco smoker: No     Systolic Blood Pressure: 128 mmHg     Is BP treated: Yes     HDL Cholesterol: 60.8 mg/dL     Total Cholesterol: 265 mg/dL

## 2024-02-12 ENCOUNTER — Ambulatory Visit: Payer: Self-pay | Admitting: Emergency Medicine

## 2024-02-24 ENCOUNTER — Ambulatory Visit (INDEPENDENT_AMBULATORY_CARE_PROVIDER_SITE_OTHER): Admitting: Physician Assistant

## 2024-02-24 ENCOUNTER — Encounter (INDEPENDENT_AMBULATORY_CARE_PROVIDER_SITE_OTHER): Payer: Self-pay | Admitting: Physician Assistant

## 2024-02-24 VITALS — BP 108/74 | HR 99 | Temp 98.0°F | Ht 66.0 in | Wt 195.0 lb

## 2024-02-24 DIAGNOSIS — H6121 Impacted cerumen, right ear: Secondary | ICD-10-CM | POA: Diagnosis not present

## 2024-02-24 DIAGNOSIS — H9193 Unspecified hearing loss, bilateral: Secondary | ICD-10-CM

## 2024-02-24 DIAGNOSIS — H9313 Tinnitus, bilateral: Secondary | ICD-10-CM

## 2024-02-25 ENCOUNTER — Encounter: Payer: Self-pay | Admitting: Emergency Medicine

## 2024-02-25 ENCOUNTER — Other Ambulatory Visit: Payer: Self-pay

## 2024-02-25 DIAGNOSIS — E785 Hyperlipidemia, unspecified: Secondary | ICD-10-CM

## 2024-02-25 MED ORDER — ROSUVASTATIN CALCIUM 10 MG PO TABS: 10.0000 mg | ORAL_TABLET | Freq: Every day | ORAL | 3 refills | Status: AC

## 2024-02-25 NOTE — Progress Notes (Signed)
 Dear Dr. Purcell, Here is my assessment for our mutual patient, Ricky [name]. Thank you for allowing me the opportunity to care for your patient. Please do not hesitate to contact me should you have any other questions. Sincerely, Chyrl Cohen PA-C  Otolaryngology Clinic Note Referring provider: Dr. Purcell HPI:  Ricky [name] is a 59 y.o. male kindly referred by Dr. Purcell   Discussed the use of AI scribe software for clinical note transcription with the patient, who gave verbal consent to proceed.  History of Present Illness   Ricky [name] is a 59 year old male with bilateral hearing loss who presents for evaluation of persistent bilateral auditory symptoms and right ear cerumen impaction.  He reports constant bilateral tinnitus for approximately two years, described as a combination of ringing and muffled static, more pronounced on the right and recently increased in intensity. He has difficulty localizing the tinnitus and notes it is not always perceived as 'in stereo.'  Over the past six to twelve months, he has experienced progressive bilateral hearing loss, worse on the right. The hearing loss has impaired his ability to communicate at work, requiring frequent repetition from others. 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  He denies any history of otitis media, otologic trauma, or prior otologic surgery. He has recurrent cerumen accumulation, particularly in the right ear, occasionally resulting in complete occlusion and requiring intervention. He has previously used over-the-counter cerumenolytics and irrigation kits and has sought urgent care for ear flushing. He denies neurologic symptoms such as numbness, tingling, weakness, or severe headaches.           Independent Review of Additional Tests or Records:  none   PMH/Meds/All/SocHx/FamHx/ROS:   Past Medical History:  Diagnosis Date   Pacemaker     Rash and nonspecific skin eruption    Ulcerative colitis (HCC)      Past Surgical History:  Procedure Laterality Date   PACEMAKER IMPLANT N/A     Family History  Problem Relation Age of Onset   COPD Mother    Alzheimer's disease Mother    Dementia Father    Heart attack Paternal Grandfather      Social Connections: Socially Isolated (02/10/2024)   Social Connection and Isolation Panel    Frequency of Communication with Friends and Family: More than three times a week    Frequency of Social Gatherings with Friends and Family: Never    Attends Religious Services: Never    Database Administrator or Organizations: No    Attends Engineer, Structural: Not on file    Marital Status: Divorced     Current Medications[1]   Physical Exam:   BP 108/74   Pulse 99   Temp 98 F (36.7 C)   Ht 5' 6 (1.676 m)   Wt 195 lb (88.5 kg)   SpO2 94%   BMI 31.47 kg/m   Pertinent Findings  CN II-XII grossly intact Right EAC with cerumen impaction, left EAC clear TM intact Anterior rhinoscopy: Septum midline; bilateral inferior turbinates with no hypertrophy No lesions of oral cavity/oropharynx; dentition WNL No obviously palpable neck masses/lymphadenopathy/thyromegaly No respiratory distress or stridor       Seprately Identifiable Procedures:  Procedure: bilateral ear microscopy and cerumen removal using microscope (CPT 30789) - Mod 25 Pre-procedure diagnosis: unilateral cerumen impaction right external auditory canal Post-procedure diagnosis: same Indication: unilateral  cerumen impaction; given patient's otologic complaints and history as well as for  improved and comprehensive examination of external ear and tympanic membrane, bilateral otologic examination using microscope was performed and impacted cerumen removed  Procedure: Patient was placed semi-recumbent. Both ear canals were examined using the microscope with findings above. Cerumen removed from the right external  auditory canal using suction and currette with improvement in EAC examination and patency. Left: EAC was patent. TM was intact . Middle ear was aerated. Drainage: none Right: EAC was patent. TM was intact . Middle ear was aerated . Drainage: none Patient tolerated the procedure well.   Impression & Plans:  Ricky [name] is a 59 y.o. male with the following   Assessment and Plan    Bilateral hearing loss and tinnitus Chronic bilateral hearing loss and tinnitus, likely multifactorial with possible noise exposure contribution. Tinnitus may be associated with hearing loss or occur independently. - Ordered audiogram to assess hearing and evaluate etiology of tinnitus and hearing loss. - Provided anticipatory guidance on tinnitus and hearing loss relationship.  Cerumen impaction, right ear  - Performed cerumen removal in right ear. - Recommended periodic in-office cerumen removal every 6-12 months or as needed if symptoms recur.           - f/u phone office visit with audio results    Thank you for allowing me the opportunity to care for your patient. Please do not hesitate to contact me should you have any other questions.  Sincerely, Chyrl Cohen PA-C [facility] Phone: [phone] Fax: [phone]  02/25/2024, 2:09 PM        [1]  Current Outpatient Medications:    docusate sodium (COLACE) 100 MG capsule, Take 100 mg by mouth daily., Disp: , Rfl:    hydrOXYzine (ATARAX/VISTARIL) 25 MG tablet, Take 25 mg by mouth every 6 (six) hours as needed. (Patient not taking: Reported on 02/24/2024), Disp: , Rfl:    ibuprofen (ADVIL,MOTRIN) 200 MG tablet, Take 400 mg by mouth every 6 (six) hours as needed for fever., Disp: , Rfl:    metoprolol tartrate (LOPRESSOR) 25 MG tablet, Take 25 mg by mouth 2 (two) times daily., Disp: , Rfl:    RINVOQ 15 MG TB24, Take 15 mg by mouth daily., Disp: , Rfl:    rosuvastatin  (CRESTOR ) 10 MG tablet, Take 1 tablet (10 mg total) by mouth  daily., Disp: 90 tablet, Rfl: 3

## 2024-02-26 ENCOUNTER — Encounter (INDEPENDENT_AMBULATORY_CARE_PROVIDER_SITE_OTHER): Payer: Self-pay

## 2024-02-26 ENCOUNTER — Telehealth (INDEPENDENT_AMBULATORY_CARE_PROVIDER_SITE_OTHER): Payer: Self-pay | Admitting: Audiology

## 2024-02-26 NOTE — Telephone Encounter (Signed)
 Called patient and left a voicemail message requesting a call back to reschedule appointment on Monday, 02/29/2024 for a Hearing Evaluation.  Office will be opening at 10am on 02/29/2024 due to inclement weather.  Also sent patient a MyChart message.

## 2024-02-29 ENCOUNTER — Ambulatory Visit (INDEPENDENT_AMBULATORY_CARE_PROVIDER_SITE_OTHER): Admitting: Audiology

## 2024-03-02 ENCOUNTER — Ambulatory Visit (INDEPENDENT_AMBULATORY_CARE_PROVIDER_SITE_OTHER): Admitting: Physician Assistant

## 2024-03-10 ENCOUNTER — Ambulatory Visit (INDEPENDENT_AMBULATORY_CARE_PROVIDER_SITE_OTHER): Admitting: Audiology

## 2024-03-10 ENCOUNTER — Ambulatory Visit (INDEPENDENT_AMBULATORY_CARE_PROVIDER_SITE_OTHER): Admitting: Physician Assistant
